# Patient Record
Sex: Female | Born: 1950 | Race: Black or African American | Hispanic: No | Marital: Married | Smoking: Never smoker
Health system: Southern US, Community
[De-identification: ages and names within clinical notes are randomized; demographics above are authoritative.]

## PROBLEM LIST (undated history)

## (undated) DIAGNOSIS — M7731 Calcaneal spur, right foot: Secondary | ICD-10-CM

## (undated) DIAGNOSIS — C50919 Malignant neoplasm of unspecified site of unspecified female breast: Secondary | ICD-10-CM

## (undated) DIAGNOSIS — Z923 Personal history of irradiation: Secondary | ICD-10-CM

## (undated) DIAGNOSIS — I10 Essential (primary) hypertension: Secondary | ICD-10-CM

## (undated) DIAGNOSIS — E785 Hyperlipidemia, unspecified: Secondary | ICD-10-CM

## (undated) DIAGNOSIS — T7840XA Allergy, unspecified, initial encounter: Secondary | ICD-10-CM

## (undated) HISTORY — DX: Malignant neoplasm of unspecified site of unspecified female breast: C50.919

## (undated) HISTORY — DX: Essential (primary) hypertension: I10

## (undated) HISTORY — DX: Allergy, unspecified, initial encounter: T78.40XA

## (undated) HISTORY — DX: Hyperlipidemia, unspecified: E78.5

---

## 1996-12-17 HISTORY — PX: ABDOMINAL HYSTERECTOMY: SHX81

## 1997-12-17 HISTORY — PX: CARPAL TUNNEL RELEASE: SHX101

## 1998-03-29 ENCOUNTER — Encounter: Admission: RE | Admit: 1998-03-29 | Discharge: 1998-03-29 | Payer: Self-pay | Admitting: Hematology and Oncology

## 1998-05-31 ENCOUNTER — Encounter: Admission: RE | Admit: 1998-05-31 | Discharge: 1998-05-31 | Payer: Self-pay | Admitting: Hematology and Oncology

## 1998-12-17 DIAGNOSIS — C50919 Malignant neoplasm of unspecified site of unspecified female breast: Secondary | ICD-10-CM

## 1998-12-17 DIAGNOSIS — Z923 Personal history of irradiation: Secondary | ICD-10-CM

## 1998-12-17 HISTORY — DX: Personal history of irradiation: Z92.3

## 1998-12-17 HISTORY — PX: BREAST LUMPECTOMY WITH AXILLARY LYMPH NODE DISSECTION: SHX5756

## 1998-12-17 HISTORY — DX: Malignant neoplasm of unspecified site of unspecified female breast: C50.919

## 1998-12-17 HISTORY — PX: BREAST LUMPECTOMY: SHX2

## 1999-04-18 ENCOUNTER — Encounter: Payer: Self-pay | Admitting: Obstetrics and Gynecology

## 1999-04-18 ENCOUNTER — Ambulatory Visit (HOSPITAL_COMMUNITY): Admission: RE | Admit: 1999-04-18 | Discharge: 1999-04-18 | Payer: Self-pay | Admitting: Obstetrics and Gynecology

## 1999-04-21 ENCOUNTER — Encounter: Payer: Self-pay | Admitting: Obstetrics and Gynecology

## 1999-04-21 ENCOUNTER — Ambulatory Visit (HOSPITAL_COMMUNITY): Admission: RE | Admit: 1999-04-21 | Discharge: 1999-04-21 | Payer: Self-pay | Admitting: Obstetrics and Gynecology

## 1999-05-02 ENCOUNTER — Ambulatory Visit (HOSPITAL_COMMUNITY): Admission: RE | Admit: 1999-05-02 | Discharge: 1999-05-02 | Payer: Self-pay | Admitting: *Deleted

## 1999-05-02 ENCOUNTER — Encounter: Payer: Self-pay | Admitting: *Deleted

## 1999-05-11 ENCOUNTER — Encounter: Admission: RE | Admit: 1999-05-11 | Discharge: 1999-08-09 | Payer: Self-pay | Admitting: Radiation Oncology

## 1999-06-02 ENCOUNTER — Encounter: Payer: Self-pay | Admitting: *Deleted

## 1999-06-02 ENCOUNTER — Ambulatory Visit (HOSPITAL_COMMUNITY): Admission: RE | Admit: 1999-06-02 | Discharge: 1999-06-02 | Payer: Self-pay | Admitting: *Deleted

## 1999-08-10 ENCOUNTER — Encounter: Admission: RE | Admit: 1999-08-10 | Discharge: 1999-11-08 | Payer: Self-pay | Admitting: Radiation Oncology

## 1999-09-21 ENCOUNTER — Ambulatory Visit (HOSPITAL_COMMUNITY): Admission: RE | Admit: 1999-09-21 | Discharge: 1999-09-21 | Payer: Self-pay | Admitting: Oncology

## 1999-09-21 ENCOUNTER — Encounter: Payer: Self-pay | Admitting: Oncology

## 2000-01-17 ENCOUNTER — Ambulatory Visit (HOSPITAL_BASED_OUTPATIENT_CLINIC_OR_DEPARTMENT_OTHER): Admission: RE | Admit: 2000-01-17 | Discharge: 2000-01-17 | Payer: Self-pay | Admitting: Orthopedic Surgery

## 2000-03-27 ENCOUNTER — Ambulatory Visit (HOSPITAL_BASED_OUTPATIENT_CLINIC_OR_DEPARTMENT_OTHER): Admission: RE | Admit: 2000-03-27 | Discharge: 2000-03-27 | Payer: Self-pay | Admitting: Orthopedic Surgery

## 2000-04-30 ENCOUNTER — Encounter: Payer: Self-pay | Admitting: Oncology

## 2000-04-30 ENCOUNTER — Encounter: Admission: RE | Admit: 2000-04-30 | Discharge: 2000-04-30 | Payer: Self-pay | Admitting: Oncology

## 2001-02-10 ENCOUNTER — Encounter: Admission: RE | Admit: 2001-02-10 | Discharge: 2001-03-04 | Payer: Self-pay | Admitting: Oncology

## 2001-04-30 ENCOUNTER — Encounter: Admission: RE | Admit: 2001-04-30 | Discharge: 2001-04-30 | Payer: Self-pay | Admitting: Oncology

## 2001-04-30 ENCOUNTER — Encounter: Payer: Self-pay | Admitting: Oncology

## 2001-08-20 ENCOUNTER — Ambulatory Visit (HOSPITAL_COMMUNITY): Admission: RE | Admit: 2001-08-20 | Discharge: 2001-08-20 | Payer: Self-pay | Admitting: *Deleted

## 2002-05-28 ENCOUNTER — Encounter: Payer: Self-pay | Admitting: Oncology

## 2002-05-28 ENCOUNTER — Encounter: Admission: RE | Admit: 2002-05-28 | Discharge: 2002-05-28 | Payer: Self-pay | Admitting: Oncology

## 2003-06-01 ENCOUNTER — Encounter: Admission: RE | Admit: 2003-06-01 | Discharge: 2003-06-01 | Payer: Self-pay | Admitting: Oncology

## 2003-06-01 ENCOUNTER — Encounter: Payer: Self-pay | Admitting: Oncology

## 2004-04-11 ENCOUNTER — Ambulatory Visit (HOSPITAL_COMMUNITY): Admission: RE | Admit: 2004-04-11 | Discharge: 2004-04-11 | Payer: Self-pay | Admitting: Family Medicine

## 2004-06-06 ENCOUNTER — Encounter: Admission: RE | Admit: 2004-06-06 | Discharge: 2004-06-06 | Payer: Self-pay | Admitting: Oncology

## 2004-10-24 ENCOUNTER — Ambulatory Visit: Payer: Self-pay | Admitting: Oncology

## 2005-04-17 ENCOUNTER — Ambulatory Visit: Payer: Self-pay | Admitting: Oncology

## 2005-06-07 ENCOUNTER — Encounter: Admission: RE | Admit: 2005-06-07 | Discharge: 2005-06-07 | Payer: Self-pay | Admitting: Oncology

## 2005-11-22 ENCOUNTER — Ambulatory Visit: Payer: Self-pay | Admitting: Oncology

## 2006-05-29 ENCOUNTER — Ambulatory Visit: Payer: Self-pay | Admitting: Oncology

## 2006-05-31 LAB — CBC WITH DIFFERENTIAL/PLATELET
BASO%: 0.3 % (ref 0.0–2.0)
EOS%: 3.9 % (ref 0.0–7.0)
MCH: 26.4 pg (ref 26.0–34.0)
MCHC: 33.3 g/dL (ref 32.0–36.0)
NEUT%: 70.6 % (ref 39.6–76.8)
RDW: 14.4 % (ref 11.3–14.5)
lymph#: 1.1 10*3/uL (ref 0.9–3.3)

## 2006-05-31 LAB — COMPREHENSIVE METABOLIC PANEL
ALT: 13 U/L (ref 0–40)
AST: 19 U/L (ref 0–37)
Calcium: 9.5 mg/dL (ref 8.4–10.5)
Chloride: 103 mEq/L (ref 96–112)
Creatinine, Ser: 0.72 mg/dL (ref 0.40–1.20)
Potassium: 3.5 mEq/L (ref 3.5–5.3)

## 2006-06-10 ENCOUNTER — Encounter: Admission: RE | Admit: 2006-06-10 | Discharge: 2006-06-10 | Payer: Self-pay | Admitting: Oncology

## 2006-06-20 ENCOUNTER — Ambulatory Visit (HOSPITAL_COMMUNITY): Admission: RE | Admit: 2006-06-20 | Discharge: 2006-06-20 | Payer: Self-pay | Admitting: Oncology

## 2006-11-27 ENCOUNTER — Ambulatory Visit: Payer: Self-pay | Admitting: Oncology

## 2006-12-17 HISTORY — PX: NASAL RECONSTRUCTION: SHX2069

## 2006-12-30 LAB — CBC WITH DIFFERENTIAL/PLATELET
BASO%: 0.3 % (ref 0.0–2.0)
Basophils Absolute: 0 10*3/uL (ref 0.0–0.1)
EOS%: 2.7 % (ref 0.0–7.0)
Eosinophils Absolute: 0.2 10*3/uL (ref 0.0–0.5)
HCT: 39.1 % (ref 34.8–46.6)
HGB: 13 g/dL (ref 11.6–15.9)
LYMPH%: 27.1 % (ref 14.0–48.0)
MCH: 26.3 pg (ref 26.0–34.0)
MCHC: 33.3 g/dL (ref 32.0–36.0)
MCV: 79.2 fL — ABNORMAL LOW (ref 81.0–101.0)
MONO#: 0.3 10*3/uL (ref 0.1–0.9)
MONO%: 4 % (ref 0.0–13.0)
NEUT#: 4.5 10*3/uL (ref 1.5–6.5)
NEUT%: 65.9 % (ref 39.6–76.8)
Platelets: 263 10*3/uL (ref 145–400)
RBC: 4.94 10*6/uL (ref 3.70–5.32)
RDW: 14.4 % (ref 11.3–14.5)
WBC: 6.8 10*3/uL (ref 3.9–10.0)
lymph#: 1.8 10*3/uL (ref 0.9–3.3)

## 2006-12-30 LAB — COMPREHENSIVE METABOLIC PANEL
ALT: 13 U/L (ref 0–35)
AST: 20 U/L (ref 0–37)
Albumin: 4.6 g/dL (ref 3.5–5.2)
Alkaline Phosphatase: 119 U/L — ABNORMAL HIGH (ref 39–117)
BUN: 22 mg/dL (ref 6–23)
CO2: 24 mEq/L (ref 19–32)
Calcium: 9.9 mg/dL (ref 8.4–10.5)
Chloride: 105 mEq/L (ref 96–112)
Creatinine, Ser: 0.71 mg/dL (ref 0.40–1.20)
Glucose, Bld: 92 mg/dL (ref 70–99)
Potassium: 3.5 mEq/L (ref 3.5–5.3)
Sodium: 142 mEq/L (ref 135–145)
Total Bilirubin: 0.9 mg/dL (ref 0.3–1.2)
Total Protein: 8 g/dL (ref 6.0–8.3)

## 2007-06-04 ENCOUNTER — Encounter (INDEPENDENT_AMBULATORY_CARE_PROVIDER_SITE_OTHER): Payer: Self-pay | Admitting: Otolaryngology

## 2007-06-04 ENCOUNTER — Ambulatory Visit (HOSPITAL_BASED_OUTPATIENT_CLINIC_OR_DEPARTMENT_OTHER): Admission: RE | Admit: 2007-06-04 | Discharge: 2007-06-04 | Payer: Self-pay | Admitting: Otolaryngology

## 2007-06-13 ENCOUNTER — Encounter: Admission: RE | Admit: 2007-06-13 | Discharge: 2007-06-13 | Payer: Self-pay | Admitting: Oncology

## 2007-09-01 ENCOUNTER — Ambulatory Visit: Payer: Self-pay | Admitting: Oncology

## 2007-09-03 LAB — COMPREHENSIVE METABOLIC PANEL
AST: 17 U/L (ref 0–37)
Albumin: 4.1 g/dL (ref 3.5–5.2)
Alkaline Phosphatase: 111 U/L (ref 39–117)
BUN: 10 mg/dL (ref 6–23)
Glucose, Bld: 102 mg/dL — ABNORMAL HIGH (ref 70–99)
Potassium: 3.8 mEq/L (ref 3.5–5.3)
Total Bilirubin: 0.8 mg/dL (ref 0.3–1.2)

## 2007-09-03 LAB — CBC WITH DIFFERENTIAL/PLATELET
Basophils Absolute: 0 10*3/uL (ref 0.0–0.1)
EOS%: 2.9 % (ref 0.0–7.0)
Eosinophils Absolute: 0.2 10*3/uL (ref 0.0–0.5)
HGB: 11.9 g/dL (ref 11.6–15.9)
LYMPH%: 23.2 % (ref 14.0–48.0)
MCH: 26.5 pg (ref 26.0–34.0)
MCV: 77.5 fL — ABNORMAL LOW (ref 81.0–101.0)
MONO%: 5.9 % (ref 0.0–13.0)
Platelets: 284 10*3/uL (ref 145–400)
RBC: 4.47 10*6/uL (ref 3.70–5.32)
RDW: 13.6 % (ref 11.3–14.5)

## 2008-02-24 ENCOUNTER — Ambulatory Visit: Payer: Self-pay | Admitting: Oncology

## 2008-02-26 LAB — COMPREHENSIVE METABOLIC PANEL
ALT: 15 U/L (ref 0–35)
Albumin: 4.2 g/dL (ref 3.5–5.2)
CO2: 26 mEq/L (ref 19–32)
Calcium: 9.4 mg/dL (ref 8.4–10.5)
Creatinine, Ser: 0.65 mg/dL (ref 0.40–1.20)
Glucose, Bld: 103 mg/dL — ABNORMAL HIGH (ref 70–99)
Total Bilirubin: 0.6 mg/dL (ref 0.3–1.2)
Total Protein: 7.7 g/dL (ref 6.0–8.3)

## 2008-02-26 LAB — CBC WITH DIFFERENTIAL/PLATELET
Basophils Absolute: 0 10*3/uL (ref 0.0–0.1)
HGB: 12.4 g/dL (ref 11.6–15.9)
MCH: 26.7 pg (ref 26.0–34.0)
MCHC: 34.3 g/dL (ref 32.0–36.0)
MONO#: 0.3 10*3/uL (ref 0.1–0.9)
NEUT#: 4 10*3/uL (ref 1.5–6.5)
Platelets: 265 10*3/uL (ref 145–400)
WBC: 5.9 10*3/uL (ref 3.9–10.0)
lymph#: 1.4 10*3/uL (ref 0.9–3.3)

## 2008-02-26 LAB — FERRITIN: Ferritin: 54 ng/mL (ref 10–291)

## 2008-02-26 LAB — IRON AND TIBC: Iron: 60 ug/dL (ref 42–145)

## 2008-06-14 ENCOUNTER — Encounter: Admission: RE | Admit: 2008-06-14 | Discharge: 2008-06-14 | Payer: Self-pay | Admitting: Oncology

## 2008-06-23 ENCOUNTER — Encounter: Admission: RE | Admit: 2008-06-23 | Discharge: 2008-06-23 | Payer: Self-pay | Admitting: Oncology

## 2008-06-24 ENCOUNTER — Ambulatory Visit: Payer: Self-pay | Admitting: Oncology

## 2008-06-29 LAB — CBC WITH DIFFERENTIAL/PLATELET
BASO%: 0.5 % (ref 0.0–2.0)
HGB: 12.1 g/dL (ref 11.6–15.9)
MCHC: 33.1 g/dL (ref 32.0–36.0)
MCV: 77.9 fL — ABNORMAL LOW (ref 81.0–101.0)
MONO%: 6.2 % (ref 0.0–13.0)
NEUT%: 66 % (ref 39.6–76.8)
Platelets: 292 10*3/uL (ref 145–400)
RBC: 4.7 10*6/uL (ref 3.70–5.32)
RDW: 14 % (ref 11.3–14.5)
WBC: 6.4 10*3/uL (ref 3.9–10.0)
lymph#: 1.6 10*3/uL (ref 0.9–3.3)

## 2008-06-29 LAB — COMPREHENSIVE METABOLIC PANEL
AST: 16 U/L (ref 0–37)
Glucose, Bld: 91 mg/dL (ref 70–99)
Potassium: 3.7 mEq/L (ref 3.5–5.3)
Total Bilirubin: 0.7 mg/dL (ref 0.3–1.2)

## 2008-06-29 LAB — IRON AND TIBC
Iron: 74 ug/dL (ref 42–145)
TIBC: 349 ug/dL (ref 250–470)
UIBC: 275 ug/dL

## 2008-06-29 LAB — FERRITIN: Ferritin: 36 ng/mL (ref 10–291)

## 2008-07-08 ENCOUNTER — Ambulatory Visit (HOSPITAL_COMMUNITY): Admission: RE | Admit: 2008-07-08 | Discharge: 2008-07-08 | Payer: Self-pay | Admitting: Oncology

## 2008-09-01 ENCOUNTER — Ambulatory Visit: Payer: Self-pay | Admitting: Oncology

## 2009-07-04 ENCOUNTER — Encounter: Admission: RE | Admit: 2009-07-04 | Discharge: 2009-07-04 | Payer: Self-pay | Admitting: Oncology

## 2009-08-24 ENCOUNTER — Ambulatory Visit: Payer: Self-pay | Admitting: Oncology

## 2009-08-25 LAB — CBC WITH DIFFERENTIAL/PLATELET
BASO%: 0.6 % (ref 0.0–2.0)
Basophils Absolute: 0 10*3/uL (ref 0.0–0.1)
Eosinophils Absolute: 0.2 10*3/uL (ref 0.0–0.5)
HGB: 12 g/dL (ref 11.6–15.9)
MCH: 26.6 pg (ref 25.1–34.0)
MCHC: 33.4 g/dL (ref 31.5–36.0)
MCV: 79.8 fL (ref 79.5–101.0)
MONO#: 0.4 10*3/uL (ref 0.1–0.9)
MONO%: 6.3 % (ref 0.0–14.0)
NEUT#: 3.8 10*3/uL (ref 1.5–6.5)
NEUT%: 65.9 % (ref 38.4–76.8)
Platelets: 258 10*3/uL (ref 145–400)
RBC: 4.52 10*6/uL (ref 3.70–5.45)
RDW: 13.7 % (ref 11.2–14.5)

## 2009-08-25 LAB — COMPREHENSIVE METABOLIC PANEL
Alkaline Phosphatase: 105 U/L (ref 39–117)
CO2: 23 mEq/L (ref 19–32)
Calcium: 8.9 mg/dL (ref 8.4–10.5)
Creatinine, Ser: 0.7 mg/dL (ref 0.40–1.20)
Potassium: 4 mEq/L (ref 3.5–5.3)

## 2009-12-23 ENCOUNTER — Encounter (INDEPENDENT_AMBULATORY_CARE_PROVIDER_SITE_OTHER): Payer: Self-pay | Admitting: *Deleted

## 2010-07-11 ENCOUNTER — Encounter: Admission: RE | Admit: 2010-07-11 | Discharge: 2010-07-11 | Payer: Self-pay | Admitting: Oncology

## 2010-08-15 ENCOUNTER — Telehealth: Payer: Self-pay | Admitting: Internal Medicine

## 2010-09-18 ENCOUNTER — Ambulatory Visit (HOSPITAL_COMMUNITY): Admission: RE | Admit: 2010-09-18 | Discharge: 2010-09-18 | Payer: Self-pay | Admitting: Orthopedic Surgery

## 2010-09-22 ENCOUNTER — Ambulatory Visit: Payer: Self-pay | Admitting: Oncology

## 2010-09-26 LAB — CBC WITH DIFFERENTIAL/PLATELET
BASO%: 0.6 % (ref 0.0–2.0)
Basophils Absolute: 0 10*3/uL (ref 0.0–0.1)
Eosinophils Absolute: 0.2 10*3/uL (ref 0.0–0.5)
HCT: 34.5 % — ABNORMAL LOW (ref 34.8–46.6)
HGB: 11.9 g/dL (ref 11.6–15.9)
MCH: 27.4 pg (ref 25.1–34.0)
MCV: 79.5 fL (ref 79.5–101.0)
MONO#: 0.3 10*3/uL (ref 0.1–0.9)
NEUT#: 3.2 10*3/uL (ref 1.5–6.5)
RDW: 13.7 % (ref 11.2–14.5)
WBC: 5.1 10*3/uL (ref 3.9–10.3)
lymph#: 1.3 10*3/uL (ref 0.9–3.3)

## 2010-09-26 LAB — COMPREHENSIVE METABOLIC PANEL
AST: 20 U/L (ref 0–37)
Albumin: 4 g/dL (ref 3.5–5.2)
CO2: 29 mEq/L (ref 19–32)
Glucose, Bld: 96 mg/dL (ref 70–99)
Total Bilirubin: 0.6 mg/dL (ref 0.3–1.2)

## 2010-10-02 ENCOUNTER — Encounter: Admission: RE | Admit: 2010-10-02 | Discharge: 2010-10-31 | Payer: Self-pay | Admitting: Orthopedic Surgery

## 2010-11-14 ENCOUNTER — Ambulatory Visit: Payer: Self-pay | Admitting: Oncology

## 2011-01-06 ENCOUNTER — Encounter: Payer: Self-pay | Admitting: Oncology

## 2011-01-07 ENCOUNTER — Encounter: Payer: Self-pay | Admitting: Oncology

## 2011-01-08 ENCOUNTER — Encounter: Payer: Self-pay | Admitting: Oncology

## 2011-01-18 NOTE — Progress Notes (Signed)
Summary: Schedule Colonoscopy  Phone Note Outgoing Call Call back at [facility] Phone [phone]   Call placed by: Harlow Mares CMA Duncan Dull),  August 15, 2010 9:15 AM Call placed to: Patient Summary of Call: Left a message on patients machine to call back. patient is due for a colonoscopy Initial call taken by: Harlow Mares CMA Duncan Dull),  August 15, 2010 9:15 AM  Follow-up for Phone Call        Left a message on the patient machine to call back and schedule a previsit and procedure with our office. A letter will be mailed to the patient.   Follow-up by: Harlow Mares CMA Duncan Dull),  August 24, 2010 2:45 PM

## 2011-01-18 NOTE — Letter (Signed)
Summary: Colonoscopy Letter  [facility]  [address] [place], [place] [address]   Phone: [phone]  Fax: [phone]      December 23, 2009 MRN: [id number]   Stacey Patel [address] [place], [place]  [address]   Dear Ms. Choy,   According to your medical record, it is time for you to schedule a Colonoscopy. The American Cancer Society recommends this procedure as a method to detect early colon cancer. Patients with a family history of colon cancer, or a personal history of colon polyps or inflammatory bowel disease are at increased risk.  This letter has beeen generated based on the recommendations made at the time of your procedure. If you feel that in your particular situation this may no longer apply, please contact our office.  Please call our office at [phone] to schedule this appointment or to update your records at your earliest convenience.  Thank you for cooperating with [place] to provide you with the very best care possible.   Sincerely,  Maryfrances Portugal. Juanda Chance, M.D.  [facility] Gastroenterology Division [phone]

## 2011-05-01 NOTE — Op Note (Signed)
Stacey Patel, Stacey Patel               ACCOUNT NO.:  [id number]   MEDICAL RECORD NO.:  [id number]          PATIENT TYPE:  AMB   LOCATION:  DSC                          FACILITY:  [facility]   PHYSICIAN:  Hermelinda Medicus, M.D.   DATE OF BIRTH:  Aug 31, 1951   DATE OF PROCEDURE:  DATE OF DISCHARGE:                               OPERATIVE REPORT   PREOPERATIVE DIAGNOSES:  1. Septal deviation.  2. Turbinate hypertrophy.  3. History of allergic rhinitis.  4. History of snoring.   POSTOPERATIVE DIAGNOSES:  1. Septal deviation.  2. Turbinate hypertrophy.  3. History of allergic rhinitis.  4. History of snoring.   OPERATION:  Septal reconstruction, turbinate reduction.   OPERATOR:  Hermelinda Medicus, M.D.   ANESTHESIA:  Local monitored anesthesia care, 1% Xylocaine with  epinephrine 5 mL and topical cocaine 125 mg of 200 which remainder was  destroyed.   ANESTHESIOLOGIST:  Quita Skye. Krista Blue, M.D.   PROCEDURE:  The patient was placed in the supine position.  Under local  MAC anesthesia was prepped and draped in the appropriate manner in the  usual head drape and operative drapes were placed.  The nose was  anesthetized and then the turbinates were reduced gaining considerable  space at this point.  The inferior turbinates were very large, redundant  and once they were outfractured, we could see much better.  The septum  was primarily pushed to the right and made a hemitransfixion incision  somewhat posteriorly in the quadrilateral cartilage, took a strip of  cartilage posteriorly, took a strip from the floor of the nose.  Then  used the open and closed Jansen-Middletons to remove a very deviated  ethmoid septum that was anterior and then the 4-mm chisel was used to  take down a spur that was approximately 1.1 cm pushing into the side of  the nose inferiorly.  This was removed by using the Lenoria Chime and the  opened and closed Jansen-Middletons.  Once this was achieved, the  remainder of the  septum was brought back to the midline and established  in the midline, and closure was completed using 4-0 through and through  septal suture x2.  Once we established the blanket suture, then we used  the L-med set at 12 electrocautery to shrink the inferior and lateral  aspect of the mucous membrane on the inferior turbinates.  Once this was  completed, an intranasal dressing was placed using Telfa.   The patient will return to see me tomorrow and then on Monday for  followup and removal of the packing tomorrow.  She is aware of the risks  and gains.  She cannot do any heavy lifting.  She should not do any  excessive exercise and to limit her diet over the next 24 hours.  Th  patient tolerated the procedure very well and will follow up with me  then at 3 weeks after the second visit and then in 6 weeks.           ______________________________  Hermelinda Medicus, M.D.     JC/MEDQ  D:  06/04/2007  T:  06/04/2007  Job:  [id number]   cc:   Pam Drown, M.D.

## 2011-05-01 NOTE — H&P (Signed)
NAMEREISE, HIETALA               ACCOUNT NO.:  [id number]   MEDICAL RECORD NO.:  [id number]          PATIENT TYPE:  AMB   LOCATION:  DSC                          FACILITY:  [facility]   PHYSICIAN:  Hermelinda Medicus, M.D.   DATE OF BIRTH:  [birth year]   DATE OF ADMISSION:  06/04/2007  DATE OF DISCHARGE:                              HISTORY & PHYSICAL   This patient is a 60 year old female who entered with some nasal  obstruction issues, also said her husband complained about some snoring  issues, but she has not had sleep apnea.  She on examination has had  considerable turbinate hypertrophy and a septal deviation to the right.  She has been on Xyzal 5, Allegra 180.  She has been on decongestants.  She has been on antibiotics in the past for sinus issues, but wishes to  see if she can get some increased nasal airway.  She now enters for a  septal reconstruction turbinate reduction.   PAST HISTORY:  Is that of:  1. Asthma.  2. Hay fever.  3. She had left breast cancer seven years ago.   She is on:  1. Femara 2.5.  2. Lisinopril/HCTZ 10/12.5.  3. Flaxseed.  4. Advil 200.  5. Vitamin E.  6. Calcium.   FAMILY HISTORY:  Quite unremarkable.   She exercises, drinks occasionally one glass of wine, otherwise she has  excellent review of systems.   She has no allergies to medications.   Her EKG showed a slight left ventricular hypertrophy.   PHYSICAL EXAMINATION:  GENERAL:  Reveals a well-nourished well-developed  female in no acute distress.  VITAL SIGNS:  Blood pressure is 130/92, she is 5 feet 2 inches, weighs  198, respirations 16, pulse 85.  HEENT:  Ears are clear.  The tympanic membranes are clear and move well.  Her nose shows a septal deviation severe to the right side pushing into  the side of her nose with a spur in the vomerine region.  Turbinates are  enlarged also with some appearance of allergy; she is taking her allergy  medication quite faithfully.  Oral cavity is  clear.  True cords, false  cords, epiglottis, base of tongue are clear of ulceration or mass.  True  cord mobility:  Gag reflex.  True mobility  EOMs, facial nerve are all  symmetrical with good shoulder strength on laryngeal examination.  Her  neck is free of any thyromegaly, cervical adenopathy or mass.  Posterior  triangle scalp and skin are clear.  Oral cavity shows no ulceration or  mass.  There are no salivary gland abnormalities.  CHEST:  Clear.  No rales, rhonchi or wheezes.  CARDIOVASCULAR:  Denies murmurs or gallops.  EXTREMITIES:  Unremarkable.   INITIAL DIAGNOSES:  1. Septal deviation.  2. Turbinate hypertrophy.  3. Mild snoring.  4. History of left breast cancer.  5. History of allergic rhinitis with associated asthma.           ______________________________  Hermelinda Medicus, M.D.     JC/MEDQ  D:  06/04/2007  T:  06/04/2007  Job:  [id number]  cc:   Pam Drown, M.D.

## 2011-05-04 NOTE — Op Note (Signed)
[place]. [facility]  Patient:    Stacey Patel                       MRN: [id number] Proc. Date: 01/17/00 Adm. Date:  [id number] Attending:  Marlowe Shores CC:         Artist Pais Mina Marble, M.D. (2)                           Operative Report  PREOPERATIVE DIAGNOSIS:  Right carpal tunnel syndrome.  POSTOPERATIVE DIAGNOSIS:  Right carpal tunnel syndrome.  OPERATION PERFORMED:  Right carpal tunnel release.  SURGEON:  Artist Pais. Mina Marble, M.D.  ANESTHESIA:  Monitored anesthesia care with 2+ plain lidocaine, 0.25% plain Marcaine field block performed by the surgeon.  TOURNIQUET TIME:  10 minutes.  COMPLICATIONS:  None.  DRAINS:  None.  DESCRIPTION OF PROCEDURE:  The patient was taken to the operating room and after the induction of adequate IV sedation, the right upper extremity was prepped and draped in the usual sterile fashion.  An Esmarch was used to exsanguinate the limb. The tourniquet was inflated to 250 mmHg.  At this point in time, a combination f 2% plain lidocaine and 0.25% Marcaine plain was injected into the palmar aspect in the right hand in the area of the thenar crease in line with the radial border f the ring finger.  Once this was done a 2 cm incision was made in the thenar crease. The incision was taken down through the skin and the subcutaneous tissues until the palmar fascia was identified.  The palmar fascia was split longitudinally thus exposing the distal one third of the transverse carpal ligament.  The distal one third of the transverse carpal ligament was divided with a 15 blade thus exposing the underlying median nerve and the contents of the carpal canal.  Once this was done using the Biomet security carpal tunnel release system, the remaining two thirds of the transverse carpal ligament was divided in its entirety. Inspection of the canal revealed no osseous lesions, ganglions, etc. present.  The  wound was thoroughly irrigated and then closed with a running 3-0 Prolene subcuticular stitch.  Steri-Strips, Xeroform, 4 x 4s, fluffs and a compressive hand dressing  were applied.  The patient tolerated the procedure well and went to the recovery room in stable fashion. DD:  01/17/00 TD:  01/17/00 Job: [id number]

## 2011-05-04 NOTE — Op Note (Signed)
[place]. [facility]  Patient:    Stacey, Patel                      MRN: [id number] Proc. Date: 03/27/00 Adm. Date:  [id number] Attending:  Marlowe Shores                           Operative Report  PREOPERATIVE DIAGNOSIS:  Carpal tunnel syndrome.  POSTOPERATIVE DIAGNOSIS:  Carpal tunnel syndrome.  PROCEDURE:  Left carpal tunnel release.  SURGEON:  Artist Pais. Mina Marble, M.D.  ANESTHESIA:  Monitored anesthesia care with 2% plain lidocaine, 1/4% Marcaine.  Field block performed by the surgeon, 6 cc.  TOURNIQUET TIME:  Nine minutes.  COMPLICATIONS:  None.  DRAINS:  None.  DESCRIPTION OF PROCEDURE:  The patient was taken to the operating room and after the induction of adequate of IV sedation, the left lower extremity was prepped and draped in the usual sterile fashion.  An Esmarch was used to _________ the limb.  The tourniquet was inflated to 250 mmHg.  At this point in time, 6 cc of a combination of 2% plain lidocaine and 1/4% plain Marcaine was injected into the palmar aspect of the left hand in the area of the thenar crease and out the radial border of the ring finger.  Once this was done and adequate anesthesia had been obtained, a 2 cm incision was made through the skin and subcutaneous tissue until the palmar fascia was identified.  The palmar fascia was split longitudinally until the distal 1/3 of the transverse carpal ligament was identified.  The distal 1/3 of the transverse carpal ligament was split using a 15 blade, thus exposing the underlying median nerve and contents of the carpal canal.  Next, using the Biomet security carpal tunnel release system, the remaining 2/3s of the transverse carpal ligament was divided in its entirety.  Visualization of the canal revealed no obvious ganglions or osseous lesions present.  The wound was thoroughly irrigated and then closed with a running Prolene subcuticular stitch of 3-0.   Steri-Strips, 4x4 fluff and compressive hand dressing was applied.  The patient tolerated the procedure well and went to the recovery room in stable fashion. DD:  03/27/00 TD:  03/28/00 Job: [id number]

## 2011-06-04 ENCOUNTER — Encounter (HOSPITAL_BASED_OUTPATIENT_CLINIC_OR_DEPARTMENT_OTHER): Payer: 59 | Admitting: Oncology

## 2011-06-04 DIAGNOSIS — Z853 Personal history of malignant neoplasm of breast: Secondary | ICD-10-CM

## 2011-06-04 DIAGNOSIS — I890 Lymphedema, not elsewhere classified: Secondary | ICD-10-CM

## 2011-06-25 ENCOUNTER — Other Ambulatory Visit: Payer: Self-pay | Admitting: Oncology

## 2011-06-25 DIAGNOSIS — Z1231 Encounter for screening mammogram for malignant neoplasm of breast: Secondary | ICD-10-CM

## 2011-07-13 ENCOUNTER — Ambulatory Visit
Admission: RE | Admit: 2011-07-13 | Discharge: 2011-07-13 | Disposition: A | Discharge status: Home / Self Care | Payer: 59 | Source: Ambulatory Visit | Attending: Oncology | Admitting: Oncology

## 2011-07-13 ENCOUNTER — Ambulatory Visit: Payer: 59

## 2011-07-13 DIAGNOSIS — Z1231 Encounter for screening mammogram for malignant neoplasm of breast: Secondary | ICD-10-CM

## 2011-09-14 LAB — CREATININE, SERUM
Creatinine, Ser: 0.68
GFR calc non Af Amer: >60

## 2011-10-03 LAB — CBC
HCT: 37.1
Hemoglobin: 12.3
MCHC: 33.2
MCV: 78.8
Platelets: 274
RBC: 4.71
RDW: 13.6
WBC: 6.2

## 2011-10-03 LAB — URINE MICROSCOPIC-ADD ON: RBC / HPF: NONE SEEN

## 2011-10-03 LAB — URINALYSIS, ROUTINE W REFLEX MICROSCOPIC
Bilirubin Urine: NEGATIVE
Glucose, UA: NEGATIVE
Hgb urine dipstick: NEGATIVE
Ketones, ur: NEGATIVE
Nitrite: NEGATIVE
Protein, ur: NEGATIVE
Specific Gravity, Urine: 1.022 (ref 1.005–1.035)
Urobilinogen, UA: 0.2
pH: 6.5

## 2011-10-03 LAB — POCT HEMOGLOBIN-HEMACUE
Hemoglobin: 12.5
Operator id: 208731

## 2012-03-17 ENCOUNTER — Encounter: Payer: Self-pay | Admitting: Internal Medicine

## 2012-06-24 ENCOUNTER — Other Ambulatory Visit: Payer: Self-pay | Admitting: Oncology

## 2012-06-24 DIAGNOSIS — Z1231 Encounter for screening mammogram for malignant neoplasm of breast: Secondary | ICD-10-CM

## 2012-06-24 DIAGNOSIS — Z853 Personal history of malignant neoplasm of breast: Secondary | ICD-10-CM

## 2012-07-16 ENCOUNTER — Ambulatory Visit
Admission: RE | Admit: 2012-07-16 | Discharge: 2012-07-16 | Disposition: A | Discharge status: Home / Self Care | Payer: 59 | Source: Ambulatory Visit | Attending: Oncology | Admitting: Oncology

## 2012-07-16 DIAGNOSIS — Z1231 Encounter for screening mammogram for malignant neoplasm of breast: Secondary | ICD-10-CM

## 2012-07-16 DIAGNOSIS — Z853 Personal history of malignant neoplasm of breast: Secondary | ICD-10-CM

## 2012-10-14 ENCOUNTER — Telehealth: Payer: Self-pay | Admitting: Oncology

## 2012-10-14 NOTE — Telephone Encounter (Signed)
Pt called and l/m that she thought she had an appt 10/30.  No appts sch and per mosaiq she missed an appt last year.   Went ahead and r/s her appt

## 2012-10-24 ENCOUNTER — Other Ambulatory Visit (HOSPITAL_BASED_OUTPATIENT_CLINIC_OR_DEPARTMENT_OTHER): Payer: 59 | Admitting: Lab

## 2012-10-24 ENCOUNTER — Telehealth: Payer: Self-pay | Admitting: Oncology

## 2012-10-24 ENCOUNTER — Ambulatory Visit (HOSPITAL_BASED_OUTPATIENT_CLINIC_OR_DEPARTMENT_OTHER): Payer: 59 | Admitting: Oncology

## 2012-10-24 ENCOUNTER — Encounter: Payer: Self-pay | Admitting: Oncology

## 2012-10-24 VITALS — BP 130/86 | HR 86 | Temp 97.1°F | Resp 20 | Ht 62.0 in | Wt 190.3 lb

## 2012-10-24 DIAGNOSIS — Z853 Personal history of malignant neoplasm of breast: Secondary | ICD-10-CM

## 2012-10-24 DIAGNOSIS — Z1231 Encounter for screening mammogram for malignant neoplasm of breast: Secondary | ICD-10-CM

## 2012-10-24 DIAGNOSIS — C50919 Malignant neoplasm of unspecified site of unspecified female breast: Secondary | ICD-10-CM | POA: Insufficient documentation

## 2012-10-24 DIAGNOSIS — Z170 Estrogen receptor positive status [ER+]: Secondary | ICD-10-CM

## 2012-10-24 LAB — CBC WITH DIFFERENTIAL/PLATELET
Basophils Absolute: 0.1 10*3/uL (ref 0.0–0.1)
Eosinophils Absolute: 0.2 10*3/uL (ref 0.0–0.5)
HGB: 12.1 g/dL (ref 11.6–15.9)
LYMPH%: 21.7 % (ref 14.0–49.7)
MCV: 80.2 fL (ref 79.5–101.0)
MONO%: 5 % (ref 0.0–14.0)
NEUT#: 4.2 10*3/uL (ref 1.5–6.5)
Platelets: 242 10*3/uL (ref 145–400)

## 2012-10-24 LAB — COMPREHENSIVE METABOLIC PANEL (CC13)
Albumin: 3.8 g/dL (ref 3.5–5.0)
Alkaline Phosphatase: 113 U/L (ref 40–150)
BUN: 14 mg/dL (ref 7.0–26.0)
Glucose: 101 mg/dl — ABNORMAL HIGH (ref 70–99)
Total Bilirubin: 0.81 mg/dL (ref 0.20–1.20)

## 2012-10-24 NOTE — Telephone Encounter (Signed)
appts made and printed for pt aom °

## 2012-10-24 NOTE — Patient Instructions (Signed)
3D mammograms (tomo) are best for you due to dense breast tissue

## 2012-10-26 NOTE — Progress Notes (Signed)
OFFICE PROGRESS NOTE   10/24/2012  Physicians: W.McNeil  INTERVAL HISTORY:  Patient is seen, alone for visit, in yearly follow up of her history of breast cancer, on observation thru this office since she completed treatment March 2009.  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 She had local radiation, tamoxifen x 5 years thru Sept 2005 and Femara from Oct 2005 thru March 2009. She had 3D/tomo mammograms at [facility] 07-16-12, still heterogeneously dense breast tissue such that the 3D mammography is very appropriate, as we have discussed now. Last breast MRI was Oct 2011. With availability of 3D mammography and as she is out 13 years from the diagnosis, we will not do routine breast MRIs now.  Patient has been doing generally well since she was here last. She had PE with Dr Uvaldo Rising recently, has been less achy since off lipitor past few months. Recent allergy symptoms are better and she has had flu vaccine. She had neck stiffness and pain related to holding phone on shoulder at work, resolved with earpiece. She has had no other new or different pain, no changes on breast self exam, intentional weight loss with diet changes and is still doing lots of walking with work. She has had no bleeding, no recent infections, no other complaints that seem related to the cancer history or that treatment. Remainder of 10 point Review of Systems negative.  Objective:  Vital signs in last 24 hours:  BP 130/86  Pulse 86  Temp 97.1 F (36.2 C) (Oral)  Resp 20  Ht 5\' 2"  (1.575 m)  Wt 190 lb 4.8 oz (86.32 kg)  BMI 34.81 kg/m2 Weight is down 8 lbs since last visit. Easily mobile, looks comfortable, just delightful as always.    HEENT:PERRLA, sclera clear, anicteric and oropharynx clear, no lesions LymphaticsCervical, supraclavicular, and axillary nodes normal. Resp: clear to auscultation bilaterally  and normal percussion bilaterally Cardio: regular rate and rhythm GI: obese, soft, nontender, no appreciable HSM Extremities: extremities normal, atraumatic, no cyanosis or edema Neuro:nonfocal Breasts: left with lumpectomy scar, no dominant mass or skin/ nipple findings, nothing in axilla and no swelling LUE. Right breast without dominant mass or other findings of concern, axilla and RUE not remarkable   Lab Results:  Results for orders placed in visit on 10/24/12  CBC WITH DIFFERENTIAL      Component Value Range   WBC 6.1  3.9 - 10.3 10e3/uL   NEUT# 4.2  1.5 - 6.5 10e3/uL   HGB 12.1  11.6 - 15.9 g/dL   HCT 21.3  08.6 - 57.8 %   Platelets 242  145 - 400 10e3/uL   MCV 80.2  79.5 - 101.0 fL   MCH 26.8  25.1 - 34.0 pg   MCHC 33.5  31.5 - 36.0 g/dL   RBC 4.69  6.29 - 5.28 10e6/uL   RDW 13.7  11.2 - 14.5 %   lymph# 1.3  0.9 - 3.3 10e3/uL   MONO# 0.3  0.1 - 0.9 10e3/uL   Eosinophils Absolute 0.2  0.0 - 0.5 10e3/uL   Basophils Absolute 0.1  0.0 - 0.1 10e3/uL   NEUT% 69.3  38.4 - 76.8 %   LYMPH% 21.7  14.0 - 49.7 %   MONO% 5.0  0.0 - 14.0 %   EOS% 2.9  0.0 - 7.0 %   BASO% 1.1  0.0 - 2.0 %  COMPREHENSIVE METABOLIC PANEL (CC13)  Component Value Range   Sodium 141  136 - 145 mEq/L   Potassium 3.7  3.5 - 5.1 mEq/L   Chloride 103  98 - 107 mEq/L   CO2 31 (*) 22 - 29 mEq/L   Glucose 101 (*) 70 - 99 mg/dl   BUN 47.8  7.0 - 29.5 mg/dL   Creatinine 0.7  0.6 - 1.1 mg/dL   Total Bilirubin 6.21  0.20 - 1.20 mg/dL   Alkaline Phosphatase 113  40 - 150 U/L   AST 19  5 - 34 U/L   ALT 12  0 - 55 U/L   Total Protein 7.6  6.4 - 8.3 g/dL   Albumin 3.8  3.5 - 5.0 g/dL   Calcium 9.7  8.4 - 30.8 mg/dL     Studies/Results:  [facility]  Medications: I have reviewed the patient's current medications.  Assessment/Plan: 1.T1N0 left breast cancer: history as above. 3D/tomo mammography yearly. She prefers to continue some follow up at this office, and I am glad to  see her back in a year or sooner if needed. 2.HTn, elevated lipids 3.some intentional weight loss and I have encouraged her to continue this   Patient is comfortable with discussion and plan     Reece Packer, MD

## 2012-10-27 ENCOUNTER — Telehealth: Payer: Self-pay

## 2012-10-27 NOTE — Telephone Encounter (Signed)
Informed Ms. Ailes about chemistries as noted by Dr. Darrold Span.  Patient did not request a copy of labs.

## 2012-10-27 NOTE — Telephone Encounter (Signed)
Message copied by Lorine Bears on Mon Oct 27, 2012  4:08 PM ------      Message from: Reece Packer      Created: Fri Oct 24, 2012  5:29 PM       Labs seen and need follow up: please let her know CMET all normal. Fine to send copies of labs if she wants

## 2013-05-12 ENCOUNTER — Other Ambulatory Visit: Payer: Self-pay | Admitting: Oncology

## 2013-05-12 ENCOUNTER — Telehealth: Payer: Self-pay | Admitting: *Deleted

## 2013-05-12 DIAGNOSIS — C50919 Malignant neoplasm of unspecified site of unspecified female breast: Secondary | ICD-10-CM

## 2013-05-12 NOTE — Telephone Encounter (Signed)
sw pt gv appt d/t for 06/10/13 @ 1:30pm. Pt is aware...td

## 2013-06-10 ENCOUNTER — Telehealth: Payer: Self-pay | Admitting: Oncology

## 2013-06-10 ENCOUNTER — Ambulatory Visit (HOSPITAL_COMMUNITY)
Admission: RE | Admit: 2013-06-10 | Discharge: 2013-06-10 | Disposition: A | Discharge status: Home / Self Care | Payer: 59 | Source: Ambulatory Visit | Attending: Oncology | Admitting: Oncology

## 2013-06-10 ENCOUNTER — Ambulatory Visit (HOSPITAL_BASED_OUTPATIENT_CLINIC_OR_DEPARTMENT_OTHER): Payer: 59 | Admitting: Oncology

## 2013-06-10 ENCOUNTER — Other Ambulatory Visit (HOSPITAL_BASED_OUTPATIENT_CLINIC_OR_DEPARTMENT_OTHER): Payer: 59 | Admitting: Lab

## 2013-06-10 ENCOUNTER — Encounter: Payer: Self-pay | Admitting: Oncology

## 2013-06-10 VITALS — BP 112/71 | HR 105 | Temp 97.5°F | Resp 18 | Ht 62.0 in | Wt 195.3 lb

## 2013-06-10 DIAGNOSIS — C50912 Malignant neoplasm of unspecified site of left female breast: Secondary | ICD-10-CM

## 2013-06-10 DIAGNOSIS — M25859 Other specified joint disorders, unspecified hip: Secondary | ICD-10-CM | POA: Insufficient documentation

## 2013-06-10 DIAGNOSIS — Z853 Personal history of malignant neoplasm of breast: Secondary | ICD-10-CM | POA: Insufficient documentation

## 2013-06-10 DIAGNOSIS — C50919 Malignant neoplasm of unspecified site of unspecified female breast: Secondary | ICD-10-CM

## 2013-06-10 DIAGNOSIS — M25559 Pain in unspecified hip: Secondary | ICD-10-CM | POA: Insufficient documentation

## 2013-06-10 DIAGNOSIS — M76899 Other specified enthesopathies of unspecified lower limb, excluding foot: Secondary | ICD-10-CM | POA: Insufficient documentation

## 2013-06-10 DIAGNOSIS — M8530 Osteitis condensans, unspecified site: Secondary | ICD-10-CM | POA: Insufficient documentation

## 2013-06-10 LAB — COMPREHENSIVE METABOLIC PANEL (CC13)
Albumin: 3.7 g/dL (ref 3.5–5.0)
Alkaline Phosphatase: 92 U/L (ref 40–150)
BUN: 15.9 mg/dL (ref 7.0–26.0)
CO2: 29 mEq/L (ref 22–29)
Calcium: 9.4 mg/dL (ref 8.4–10.4)
Glucose: 107 mg/dl — ABNORMAL HIGH (ref 70–99)
Potassium: 3.2 mEq/L — ABNORMAL LOW (ref 3.5–5.1)

## 2013-06-10 LAB — CBC WITH DIFFERENTIAL/PLATELET
Basophils Absolute: 0.1 10*3/uL (ref 0.0–0.1)
Eosinophils Absolute: 0.2 10*3/uL (ref 0.0–0.5)
HGB: 11.9 g/dL (ref 11.6–15.9)
MCV: 78.5 fL — ABNORMAL LOW (ref 79.5–101.0)
MONO%: 6.9 % (ref 0.0–14.0)
NEUT#: 4.2 10*3/uL (ref 1.5–6.5)
Platelets: 249 10*3/uL (ref 145–400)
RDW: 14.3 % (ref 11.2–14.5)

## 2013-06-10 NOTE — Patient Instructions (Signed)
Call if needed prior to next appointment   [phone] 

## 2013-06-10 NOTE — Progress Notes (Signed)
OFFICE PROGRESS NOTE   06/10/2013   Physicians: W.McNeil, D.Brodie  INTERVAL HISTORY:   Patient is seen, alone for visit, in follow up of history of breast cancer, on observation thru this office since she completed treatment in March 2009. She has been well other than problems with left hip thought bursitis, being treated by PCP Dr Uvaldo Rising, with follow up visit upcoming. 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 She does not have PAC.  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 She had local radiation, tamoxifen x 5 years thru Sept 2005 and Femara from Oct 2005 thru March 2009. She had 3D/tomo mammograms at [facility] 07-16-12, still heterogeneously dense breast tissue such that the 3D mammography is very appropriate, as we have discussed. Last breast MRI was Oct 2011. With availability of 3D mammography and as she is out 14 years from the diagnosis, we will not do routine breast MRIs now. Patient prefers to continue yearly follow up at this office.  Left hip pain x 2+ months, worse with direct pressure or with certain positions. No low back discomfort, however pain at times runs down lateral left LE. No fever or symptoms of infection. No trauma. No other pain. Energy would be good if not for this discomfort. No respiratory or GI complaints. No bleeding.  Remainder of 10 point Review of Systems negative.  Objective:  Vital signs in last 24 hours:  BP 112/71  Pulse 105  Temp(Src) 97.5 F (36.4 C) (Oral)  Resp 18  Ht 5\' 2"  (1.575 m)  Wt 195 lb 4.8 oz (88.587 kg)  BMI 35.71 kg/m2  Weight is up 5 lbs. Ambulatory a little slowly from left hip symptoms.  HEENT:PERRLA, sclera clear, anicteric and oropharynx clear, no lesions LymphaticsCervical, supraclavicular, and axillary nodes normal. Resp: clear to auscultation bilaterally and normal percussion bilaterally Cardio: regular  rate and rhythm GI: obese, soft, nontender, normal bowel sounds, no appreciable HSM Extremities: no pitting edema, cords, tenderness. Neuro:nonfocal Breast: left with well healed lumpectomy scar, no dominant mass, no skin or nipple findings and left axilla benign. Right without dominant mass, skin or nipple findings Spine nontender to palpation Skin without rash or ecchymosis  Lab Results:  Results for orders placed in visit on 06/10/13  CBC WITH DIFFERENTIAL      Result Value Range   WBC 6.5  3.9 - 10.3 10e3/uL   NEUT# 4.2  1.5 - 6.5 10e3/uL   HGB 11.9  11.6 - 15.9 g/dL   HCT 19.1  47.8 - 29.5 %   Platelets 249  145 - 400 10e3/uL   MCV 78.5 (*) 79.5 - 101.0 fL   MCH 26.2  25.1 - 34.0 pg   MCHC 33.4  31.5 - 36.0 g/dL   RBC 6.21  3.08 - 6.57 10e6/uL   RDW 14.3  11.2 - 14.5 %   lymph# 1.6  0.9 - 3.3 10e3/uL   MONO# 0.4  0.1 - 0.9 10e3/uL   Eosinophils Absolute 0.2  0.0 - 0.5 10e3/uL   Basophils Absolute 0.1  0.0 - 0.1 10e3/uL   NEUT% 64.8  38.4 - 76.8 %   LYMPH% 24.6  14.0 - 49.7 %   MONO% 6.9  0.0 - 14.0 %   EOS% 2.4  0.0 - 7.0 %   BASO% 1.3  0.0 - 2.0 %  COMPREHENSIVE METABOLIC PANEL (CC13)      Result Value  Range   Sodium 141  136 - 145 mEq/L   Potassium 3.2 (*) 3.5 - 5.1 mEq/L   Chloride 103  98 - 107 mEq/L   CO2 29  22 - 29 mEq/L   Glucose 107 (*) 70 - 99 mg/dl   BUN 84.6  7.0 - 96.2 mg/dL   Creatinine 0.8  0.6 - 1.1 mg/dL   Total Bilirubin 9.52  0.20 - 1.20 mg/dL   Alkaline Phosphatase 92  40 - 150 U/L   AST 22  5 - 34 U/L   ALT 17  0 - 55 U/L   Total Protein 7.7  6.4 - 8.3 g/dL   Albumin 3.7  3.5 - 5.0 g/dL   Calcium 9.4  8.4 - 84.1 mg/dL     Studies/Results:  Dg Hip Complete Left  06/10/2013   *RADIOLOGY REPORT*  Clinical Data: Increasing posterior left hip pain over 2 months without injury, history of breast cancer 13 years ago  LEFT HIP - COMPLETE 2+ VIEW  Comparison: None  Findings: Bilateral hip joint space narrowing. Scattered mild pelvic enthesopathy.  SI joints symmetric. Minimal osteitis pubis. No acute fracture, dislocation, destruction.  IMPRESSION: Mild degenerative changes of both hips. No acute abnormalities.   Original Report Authenticated By: Ulyses Southward, M.D.   She is due bilateral mammograms at [facility] early Aug, which should be 3D/ tomo due to dense breast tissue and personal history of breast cancer.   Medications: I have reviewed the patient's current medications.  Assessment/Plan:  1.T1N0 left breast cancer: history as above, no clinical evidence of active disease. She will have 3D mammography in early August and will be seen back by medical oncology in 1 year, or sooner if needed. 2.left hip pain: plain Xrays with mild degenerative findings, no concern for metastatic disease by that imaging 3.obesity 4.HTN, elevated lipids   Patient is in agreement with plan above.     Reece Packer, MD   06/10/2013, 9:36 PM

## 2013-06-12 ENCOUNTER — Telehealth: Payer: Self-pay

## 2013-06-12 NOTE — Telephone Encounter (Signed)
Message copied by Lorine Bears on Fri Jun 12, 2013  3:53 PM ------      Message from: Reece Packer      Created: Wed Jun 10, 2013  4:03 PM       Labs seen and need follow up: please let her know no fracture, dislocation and no suggestion of cancer. She has some arthritis of both hips that the radiologist can tell. Keep appointment with Dr Uvaldo Rising as scheduled and we will send report to her. ------

## 2013-06-12 NOTE — Telephone Encounter (Signed)
Spoke with Ms. Shackleford and told her the results of the x-ray as noted below by Dr. Darrold Span. Sent a copy of the x-ray report to Dr. Uvaldo Rising as requested by Dr. Darrold Span.

## 2013-06-15 ENCOUNTER — Telehealth: Payer: Self-pay | Admitting: *Deleted

## 2013-06-15 NOTE — Telephone Encounter (Signed)
Message copied by Phillis Knack on Mon Jun 15, 2013 10:57 AM ------      Message from: Jama Flavors P      Created: Mon Jun 15, 2013  9:02 AM       Labs seen and need follow up  Please let her know K+ just a little low on these labs. Needs to increase at least in diet ------

## 2013-06-15 NOTE — Telephone Encounter (Signed)
Pt notified of results below. States she had a craving for bananas and oj this weekend. Has been drinking extra oj and will increase potassium in diet

## 2013-07-17 ENCOUNTER — Ambulatory Visit: Payer: 59

## 2013-07-28 ENCOUNTER — Ambulatory Visit: Payer: 59 | Attending: Family Medicine

## 2013-07-28 DIAGNOSIS — R269 Unspecified abnormalities of gait and mobility: Secondary | ICD-10-CM | POA: Insufficient documentation

## 2013-07-28 DIAGNOSIS — M25559 Pain in unspecified hip: Secondary | ICD-10-CM | POA: Insufficient documentation

## 2013-07-28 DIAGNOSIS — IMO0001 Reserved for inherently not codable concepts without codable children: Secondary | ICD-10-CM | POA: Insufficient documentation

## 2013-07-30 ENCOUNTER — Ambulatory Visit: Payer: 59

## 2013-08-04 ENCOUNTER — Ambulatory Visit
Admission: RE | Admit: 2013-08-04 | Discharge: 2013-08-04 | Disposition: A | Discharge status: Home / Self Care | Payer: 59 | Source: Ambulatory Visit | Attending: Oncology | Admitting: Oncology

## 2013-08-04 ENCOUNTER — Ambulatory Visit: Payer: 59

## 2013-08-04 DIAGNOSIS — Z1231 Encounter for screening mammogram for malignant neoplasm of breast: Secondary | ICD-10-CM

## 2013-08-06 ENCOUNTER — Ambulatory Visit: Payer: 59 | Admitting: Physical Therapy

## 2013-08-11 ENCOUNTER — Ambulatory Visit: Payer: 59

## 2013-08-13 ENCOUNTER — Ambulatory Visit: Payer: 59

## 2013-08-18 ENCOUNTER — Ambulatory Visit: Payer: 59 | Attending: Family Medicine

## 2013-08-18 DIAGNOSIS — R269 Unspecified abnormalities of gait and mobility: Secondary | ICD-10-CM | POA: Insufficient documentation

## 2013-08-18 DIAGNOSIS — M25559 Pain in unspecified hip: Secondary | ICD-10-CM | POA: Insufficient documentation

## 2013-08-18 DIAGNOSIS — IMO0001 Reserved for inherently not codable concepts without codable children: Secondary | ICD-10-CM | POA: Insufficient documentation

## 2013-08-20 ENCOUNTER — Ambulatory Visit: Payer: 59 | Admitting: Rehabilitation

## 2013-08-25 ENCOUNTER — Ambulatory Visit: Payer: 59 | Admitting: Rehabilitation

## 2013-08-27 ENCOUNTER — Ambulatory Visit: Payer: 59

## 2013-09-14 ENCOUNTER — Other Ambulatory Visit: Payer: 59 | Admitting: Lab

## 2013-09-14 ENCOUNTER — Ambulatory Visit: Payer: 59 | Admitting: Oncology

## 2013-10-30 ENCOUNTER — Ambulatory Visit: Payer: 59 | Admitting: Oncology

## 2014-06-22 ENCOUNTER — Encounter: Payer: Self-pay | Admitting: Internal Medicine

## 2014-07-07 ENCOUNTER — Other Ambulatory Visit: Payer: Self-pay

## 2014-07-07 DIAGNOSIS — Z1231 Encounter for screening mammogram for malignant neoplasm of breast: Secondary | ICD-10-CM

## 2014-08-06 ENCOUNTER — Ambulatory Visit
Admission: RE | Admit: 2014-08-06 | Discharge: 2014-08-06 | Disposition: A | Discharge status: Home / Self Care | Payer: 59 | Source: Ambulatory Visit

## 2014-08-06 ENCOUNTER — Ambulatory Visit (AMBULATORY_SURGERY_CENTER): Payer: Self-pay | Admitting: *Deleted

## 2014-08-06 VITALS — Ht 62.0 in | Wt 196.0 lb

## 2014-08-06 DIAGNOSIS — Z1211 Encounter for screening for malignant neoplasm of colon: Secondary | ICD-10-CM

## 2014-08-06 DIAGNOSIS — Z1231 Encounter for screening mammogram for malignant neoplasm of breast: Secondary | ICD-10-CM

## 2014-08-06 MED ORDER — MOVIPREP 100 G PO SOLR: 1.0000 | Freq: Once | ORAL | Status: DC

## 2014-08-06 NOTE — Progress Notes (Signed)
No egg or soy allergy. No anesthesia problems.  No home O2.  No diet meds.  

## 2014-08-20 ENCOUNTER — Encounter: Payer: 59 | Admitting: Internal Medicine

## 2014-08-25 ENCOUNTER — Encounter: Payer: Self-pay | Admitting: Internal Medicine

## 2014-08-25 ENCOUNTER — Ambulatory Visit (AMBULATORY_SURGERY_CENTER): Payer: 59 | Admitting: Internal Medicine

## 2014-08-25 VITALS — BP 146/59 | HR 61 | Temp 97.8°F | Resp 18 | Ht 62.0 in | Wt 196.0 lb

## 2014-08-25 DIAGNOSIS — K639 Disease of intestine, unspecified: Secondary | ICD-10-CM

## 2014-08-25 DIAGNOSIS — K6389 Other specified diseases of intestine: Secondary | ICD-10-CM

## 2014-08-25 DIAGNOSIS — D126 Benign neoplasm of colon, unspecified: Secondary | ICD-10-CM

## 2014-08-25 DIAGNOSIS — Z1211 Encounter for screening for malignant neoplasm of colon: Secondary | ICD-10-CM

## 2014-08-25 MED ORDER — SODIUM CHLORIDE 0.9 % IV SOLN: 500.0000 mL | INTRAVENOUS | Status: DC

## 2014-08-25 NOTE — Progress Notes (Signed)
A/ox3, pleased with MAC, report to RN 

## 2014-08-25 NOTE — Op Note (Signed)
[place] [facility] [place]  [employer] [place], [id number]   COLONOSCOPY PROCEDURE REPORT  PATIENT: Stacey Patel, Stacey Patel  MR#: [id number] BIRTHDATE: 08/04/1951 , 9  yrs. old GENDER: Female ENDOSCOPIST: Lafayette Dragon, MD REFERRED RJ:JOACZ Addison Lank, M.D. ,Dr Marko Plume PROCEDURE DATE:  08/25/2014 PROCEDURE:   Colonoscopy with biopsy First Screening Colonoscopy - Avg.  risk and is 50 yrs.  old or older - No.  Prior Negative Screening - Now for repeat screening. N/A  History of Adenoma - Now for follow-up colonoscopy & has been > or = to 3 yrs.  N/A  Polyps Removed Today? No.  Recommend repeat exam, <10 yrs? No. ASA CLASS:   Class II INDICATIONS:Average risk patient for colon cancer and prior colonoscopy in February 2004 was normal. MEDICATIONS: MAC sedation, administered by CRNA and propofol (Diprivan) 200mg  IV  DESCRIPTION OF PROCEDURE:   After the risks benefits and alternatives of the procedure were thoroughly explained, informed consent was obtained.  A digital rectal exam revealed no abnormalities of the rectum.   The LB PFC-H190 [id number] and LB PFC-H190 [id number]  endoscope was introduced through the anus and advanced to the cecum, which was identified by both the appendix and ileocecal valve. No adverse events experienced.   The quality of the prep was good, using MoviPrep  The instrument was then slowly withdrawn as the colon was fully examined.      COLON FINDINGS: There was mild diverticulosis noted in the sigmoid colon with associated muscular hypertrophy.   Submucosal nodule at 50 cm in the descending colon.  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  Biopsies obtained.  Retroflexed views revealed no abnormalities. The time to cecum=5 minutes 14 seconds. Withdrawal time=8 minutes 41 seconds.  The scope was withdrawn and the procedure completed. COMPLICATIONS: There were no complications.  ENDOSCOPIC IMPRESSION: 1.   There was mild diverticulosis  noted in the sigmoid colon 2.   Submucosal nodule at 50 cm in the descending colon.  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  Biopsies obtained  RECOMMENDATIONS: 1.  Await pathology results 2.  high fiber diet Recall colonoscopy in 10 to   eSigned:  Lafayette Dragon, MD 08/25/2014 10:22 AM   cc:   PATIENT NAME:  Stacey Patel, Stacey Patel MR#: [id number]

## 2014-08-25 NOTE — Patient Instructions (Signed)
YOU HAD AN ENDOSCOPIC PROCEDURE TODAY AT THE [facility]: Refer to the procedure report that was given to you for any specific questions about what was found during the examination.  If the procedure report does not answer your questions, please call your gastroenterologist to clarify.  If you requested that your care partner not be given the details of your procedure findings, then the procedure report has been included in a sealed envelope for you to review at your convenience later.  YOU SHOULD EXPECT: Some feelings of bloating in the abdomen. Passage of more gas than usual.  Walking can help get rid of the air that was put into your GI tract during the procedure and reduce the bloating. If you had a lower endoscopy (such as a colonoscopy or flexible sigmoidoscopy) you may notice spotting of blood in your stool or on the toilet paper. If you underwent a bowel prep for your procedure, then you may not have a normal bowel movement for a few days.  DIET: Your first meal following the procedure should be a light meal and then it is ok to progress to your normal diet.  A half-sandwich or bowl of soup is an example of a good first meal.  Heavy or fried foods are harder to digest and may make you feel nauseous or bloated.  Likewise meals heavy in dairy and vegetables can cause extra gas to form and this can also increase the bloating.  Drink plenty of fluids but you should avoid alcoholic beverages for 24 hours.  ACTIVITY: Your care partner should take you home directly after the procedure.  You should plan to take it easy, moving slowly for the rest of the day.  You can resume normal activity the day after the procedure however you should NOT DRIVE or use heavy machinery for 24 hours (because of the sedation medicines used during the test).    SYMPTOMS TO REPORT IMMEDIATELY: A gastroenterologist can be reached at any hour.  During normal business hours, 8:30 AM to 5:00 PM Monday through Friday,  call [phone].  After hours and on weekends, please call the GI answering service at [phone] who will take a message and have the physician on call contact you.   Following lower endoscopy (colonoscopy or flexible sigmoidoscopy):  Excessive amounts of blood in the stool  Significant tenderness or worsening of abdominal pains  Swelling of the abdomen that is new, acute  Fever of 100F or higher  FOLLOW UP: If any biopsies were taken you will be contacted by phone or by letter within the next 1-3 weeks.  Call your gastroenterologist if you have not heard about the biopsies in 3 weeks.  Our staff will call the home number listed on your records the next business day following your procedure to check on you and address any questions or concerns that you may have at that time regarding the information given to you following your procedure. This is a courtesy call and so if there is no answer at the home number and we have not heard from you through the emergency physician on call, we will assume that you have returned to your regular daily activities without incident.  SIGNATURES/CONFIDENTIALITY: You and/or your care partner have signed paperwork which will be entered into your electronic medical record.  These signatures attest to the fact that that the information above on your After Visit Summary has been reviewed and is understood.  Full responsibility of the confidentiality of this   discharge information lies with you and/or your care-partner.  Recommendations Await pathology results High fiber diet

## 2014-08-25 NOTE — Progress Notes (Signed)
Called to room to assist during endoscopic procedure.  Patient ID and intended procedure confirmed with present staff. Received instructions for my participation in the procedure from the performing physician.  

## 2014-08-26 ENCOUNTER — Telehealth: Payer: Self-pay

## 2014-08-26 NOTE — Telephone Encounter (Signed)
  Follow up Call-  Call back number 08/25/2014  Post procedure Call Back phone  # [phone]- work or [phone] home  Permission to leave phone message Yes     Patient questions:  Do you have a fever, pain , or abdominal swelling? No. Pain Score  0 *  Have you tolerated food without any problems? Yes.    Have you been able to return to your normal activities? Yes.    Do you have any questions about your discharge instructions: Diet   No. Medications  No. Follow up visit  No.  Do you have questions or concerns about your Care? No.  Actions: * If pain score is 4 or above: No action needed, pain <4.

## 2014-08-31 ENCOUNTER — Encounter: Payer: Self-pay | Admitting: Internal Medicine

## 2014-09-01 ENCOUNTER — Encounter: Payer: Self-pay | Admitting: *Deleted

## 2015-07-25 ENCOUNTER — Other Ambulatory Visit: Payer: Self-pay

## 2015-07-25 DIAGNOSIS — Z1231 Encounter for screening mammogram for malignant neoplasm of breast: Secondary | ICD-10-CM

## 2015-08-18 ENCOUNTER — Ambulatory Visit
Admission: RE | Admit: 2015-08-18 | Discharge: 2015-08-18 | Disposition: A | Discharge status: Home / Self Care | Payer: 59 | Source: Ambulatory Visit

## 2015-08-18 DIAGNOSIS — Z1231 Encounter for screening mammogram for malignant neoplasm of breast: Secondary | ICD-10-CM

## 2016-01-05 MED FILL — LISINOPRIL-HCTZ 10-12.5 MG: 10-12.5 | 90 days supply | Qty: 90 | Fill #1

## 2016-01-25 DIAGNOSIS — M19071 Primary osteoarthritis, right ankle and foot: Secondary | ICD-10-CM | POA: Diagnosis not present

## 2016-01-25 DIAGNOSIS — M25571 Pain in right ankle and joints of right foot: Secondary | ICD-10-CM | POA: Diagnosis not present

## 2016-01-25 DIAGNOSIS — M2011 Hallux valgus (acquired), right foot: Secondary | ICD-10-CM | POA: Diagnosis not present

## 2016-02-21 MED FILL — COUGH DM ER 30 MG/5 ML SUSP: 30 | 4 days supply | Qty: 89 | Fill #0

## 2016-02-21 MED FILL — SM FEXOFENADINE 180 MG TAB: 180 | 30 days supply | Qty: 30 | Fill #0

## 2016-03-23 DIAGNOSIS — Z0000 Encounter for general adult medical examination without abnormal findings: Secondary | ICD-10-CM | POA: Diagnosis not present

## 2016-03-23 DIAGNOSIS — I10 Essential (primary) hypertension: Secondary | ICD-10-CM | POA: Diagnosis not present

## 2016-03-23 DIAGNOSIS — N959 Unspecified menopausal and perimenopausal disorder: Secondary | ICD-10-CM | POA: Diagnosis not present

## 2016-03-23 DIAGNOSIS — E782 Mixed hyperlipidemia: Secondary | ICD-10-CM | POA: Diagnosis not present

## 2016-03-23 DIAGNOSIS — M159 Polyosteoarthritis, unspecified: Secondary | ICD-10-CM | POA: Diagnosis not present

## 2016-03-23 DIAGNOSIS — Z853 Personal history of malignant neoplasm of breast: Secondary | ICD-10-CM | POA: Diagnosis not present

## 2016-03-23 DIAGNOSIS — Z23 Encounter for immunization: Secondary | ICD-10-CM | POA: Diagnosis not present

## 2016-03-23 DIAGNOSIS — J309 Allergic rhinitis, unspecified: Secondary | ICD-10-CM | POA: Diagnosis not present

## 2016-03-23 DIAGNOSIS — R739 Hyperglycemia, unspecified: Secondary | ICD-10-CM | POA: Diagnosis not present

## 2016-03-26 ENCOUNTER — Other Ambulatory Visit: Payer: Self-pay | Admitting: Family Medicine

## 2016-03-26 ENCOUNTER — Other Ambulatory Visit: Payer: Self-pay

## 2016-03-26 DIAGNOSIS — Z1231 Encounter for screening mammogram for malignant neoplasm of breast: Secondary | ICD-10-CM

## 2016-03-26 DIAGNOSIS — E2839 Other primary ovarian failure: Secondary | ICD-10-CM

## 2016-04-18 MED FILL — LISINOPRIL-HCTZ 10-12.5 MG: 10-12.5 | 90 days supply | Qty: 90 | Fill #0

## 2016-06-27 DIAGNOSIS — H52223 Regular astigmatism, bilateral: Secondary | ICD-10-CM | POA: Diagnosis not present

## 2016-06-27 DIAGNOSIS — H35031 Hypertensive retinopathy, right eye: Secondary | ICD-10-CM | POA: Diagnosis not present

## 2016-06-27 DIAGNOSIS — I10 Essential (primary) hypertension: Secondary | ICD-10-CM | POA: Diagnosis not present

## 2016-06-27 DIAGNOSIS — H524 Presbyopia: Secondary | ICD-10-CM | POA: Diagnosis not present

## 2016-06-27 DIAGNOSIS — H5203 Hypermetropia, bilateral: Secondary | ICD-10-CM | POA: Diagnosis not present

## 2016-06-27 DIAGNOSIS — H25813 Combined forms of age-related cataract, bilateral: Secondary | ICD-10-CM | POA: Diagnosis not present

## 2016-08-01 ENCOUNTER — Other Ambulatory Visit: Payer: Self-pay | Admitting: Family Medicine

## 2016-08-01 DIAGNOSIS — Z1231 Encounter for screening mammogram for malignant neoplasm of breast: Secondary | ICD-10-CM

## 2016-08-02 MED FILL — LISINOPRIL-HCTZ 10-12.5 MG: 10-12.5 | 90 days supply | Qty: 90 | Fill #1

## 2016-08-27 ENCOUNTER — Ambulatory Visit
Admission: RE | Admit: 2016-08-27 | Discharge: 2016-08-27 | Disposition: A | Discharge status: Home / Self Care | Payer: 59 | Source: Ambulatory Visit | Attending: Family Medicine | Admitting: Family Medicine

## 2016-08-27 DIAGNOSIS — Z1231 Encounter for screening mammogram for malignant neoplasm of breast: Secondary | ICD-10-CM | POA: Diagnosis not present

## 2016-09-19 MED FILL — CELECOXIB 200 MG CAPSULE: 200 | 90 days supply | Qty: 90 | Fill #1

## 2016-09-21 DIAGNOSIS — M542 Cervicalgia: Secondary | ICD-10-CM | POA: Diagnosis not present

## 2016-09-21 DIAGNOSIS — E782 Mixed hyperlipidemia: Secondary | ICD-10-CM | POA: Diagnosis not present

## 2016-09-21 DIAGNOSIS — I10 Essential (primary) hypertension: Secondary | ICD-10-CM | POA: Diagnosis not present

## 2016-09-21 DIAGNOSIS — M159 Polyosteoarthritis, unspecified: Secondary | ICD-10-CM | POA: Diagnosis not present

## 2016-09-21 DIAGNOSIS — Z853 Personal history of malignant neoplasm of breast: Secondary | ICD-10-CM | POA: Diagnosis not present

## 2016-09-21 DIAGNOSIS — R739 Hyperglycemia, unspecified: Secondary | ICD-10-CM | POA: Diagnosis not present

## 2016-09-21 DIAGNOSIS — J309 Allergic rhinitis, unspecified: Secondary | ICD-10-CM | POA: Diagnosis not present

## 2016-11-12 MED FILL — LISINOPRIL-HCTZ 10-12.5 MG: 10-12.5 | 90 days supply | Qty: 90 | Fill #0

## 2017-02-22 MED FILL — LISINOPRIL-HCTZ 10-12.5 MG: 10-12.5 | 90 days supply | Qty: 90 | Fill #1

## 2017-03-25 DIAGNOSIS — R739 Hyperglycemia, unspecified: Secondary | ICD-10-CM | POA: Diagnosis not present

## 2017-03-25 DIAGNOSIS — I10 Essential (primary) hypertension: Secondary | ICD-10-CM | POA: Diagnosis not present

## 2017-03-25 DIAGNOSIS — M160 Bilateral primary osteoarthritis of hip: Secondary | ICD-10-CM | POA: Diagnosis not present

## 2017-03-25 DIAGNOSIS — Z0000 Encounter for general adult medical examination without abnormal findings: Secondary | ICD-10-CM | POA: Diagnosis not present

## 2017-03-25 DIAGNOSIS — J309 Allergic rhinitis, unspecified: Secondary | ICD-10-CM | POA: Diagnosis not present

## 2017-03-25 DIAGNOSIS — Z853 Personal history of malignant neoplasm of breast: Secondary | ICD-10-CM | POA: Diagnosis not present

## 2017-03-25 DIAGNOSIS — N959 Unspecified menopausal and perimenopausal disorder: Secondary | ICD-10-CM | POA: Diagnosis not present

## 2017-03-25 DIAGNOSIS — E782 Mixed hyperlipidemia: Secondary | ICD-10-CM | POA: Diagnosis not present

## 2017-03-25 DIAGNOSIS — M159 Polyosteoarthritis, unspecified: Secondary | ICD-10-CM | POA: Diagnosis not present

## 2017-03-25 DIAGNOSIS — M542 Cervicalgia: Secondary | ICD-10-CM | POA: Diagnosis not present

## 2017-04-26 ENCOUNTER — Encounter: Payer: 59 | Attending: Family Medicine | Admitting: Registered"

## 2017-04-26 ENCOUNTER — Encounter: Payer: Self-pay | Admitting: Registered"

## 2017-04-26 DIAGNOSIS — E119 Type 2 diabetes mellitus without complications: Secondary | ICD-10-CM | POA: Diagnosis not present

## 2017-04-26 DIAGNOSIS — Z713 Dietary counseling and surveillance: Secondary | ICD-10-CM | POA: Insufficient documentation

## 2017-04-26 NOTE — Patient Instructions (Addendum)
   Ideas to get potassium in your diet: black beans, potatoes, sweet potatoes, beets, spinach   Keep using beans and chick peas in your diet  Plan:   Aim for 2-3 Carb Choices per meal (30-45 grams) +/- 1 either way   Aim for 0-1 Carbs per snack if hungry   Include protein in moderation with your meals and snacks  Consider eating a small handful of nuts each day  Consider reading food labels for Total Carbohydrate and Sat Fat Grams of foods  Read the smoothie nutrition facts label to make sure the grams are around 15 and include a protein if less than 5 grams in the serving  Consider increasing your activity level daily as tolerated

## 2017-04-26 NOTE — Progress Notes (Signed)
Diabetes Self-Management Education  Visit Type: First/Initial  Appt. Start Time: 0945 Appt. End Time: 1115  04/26/2017  Ms. Stacey Patel, identified by name and date of birth, is a 66 y.o. female with a diagnosis of Diabetes: Type 2.   ASSESSMENT Pt states she gave up soda 6 months ago and started drinking mineral water. Pt reports Dr. Leonides Schanz told her not to have too much mineral water. Pt states she recently increased her physical activity and will walk during breaks at work and will walk around the parking lot before leaving work.  Pt states she was drinking orange juice to get potassium in her diet. RD suggested other foods, all of which patients said she really enjoys.      Diabetes Self-Management Education - 04/26/17 0948      Visit Information   Visit Type First/Initial     Initial Visit   Diabetes Type Type 2   Are you currently following a meal plan? Yes   What type of meal plan do you follow? tries to eat healthy   Are you taking your medications as prescribed? Yes     Psychosocial Assessment   How often do you need to have someone help you when you read instructions, pamphlets, or other written materials from your doctor or pharmacy? 1 - Never   What is the last grade level you completed in school? 1 year of college     Complications   Last HgB A1C per patient/outside source 6.7 %  per chart 03/25/17   How often do you check your blood sugar? 0 times/day (not testing)   Have you had a dilated eye exam in the past 12 months? Yes   Have you had a dental exam in the past 12 months? Yes   Are you checking your feet? Yes   How many days per week are you checking your feet? 7     Dietary Intake   Breakfast oatmeal, black pepper   Snack (morning) noen OR candy, cheetoes   Lunch green beans and tuna OR tuna and salad   Snack (afternoon) none OR cheetos   Dinner fried chicken, green beans and potatoes   Snack (evening) smoothie (pinacolata)   Beverage(s) mineral water,  (no soda any more)     Exercise   Exercise Type Light (walking / raking leaves)   How many days per week to you exercise? 5   How many minutes per day do you exercise? 10   Total minutes per week of exercise 50     Patient Education   Previous Diabetes Education No   Disease state  Definition of diabetes, type 1 and 2, and the diagnosis of diabetes;Factors that contribute to the development of diabetes   Nutrition management  Role of diet in the treatment of diabetes and the relationship between the three main macronutrients and blood glucose level;Food label reading, portion sizes and measuring food.;Carbohydrate counting   Physical activity and exercise  Role of exercise on diabetes management, blood pressure control and cardiac health.   Monitoring Identified appropriate SMBG and/or A1C goals.   Chronic complications Relationship between chronic complications and blood glucose control;Assessed and discussed foot care and prevention of foot problems;Retinopathy and reason for yearly dilated eye exams     Individualized Goals (developed by patient)   Nutrition Follow meal plan discussed   Physical Activity Exercise 5-7 days per week     Outcomes   Expected Outcomes Demonstrated interest in learning. Expect positive outcomes  Future DMSE PRN   Program Status Completed    Individualized Plan for Diabetes Self-Management Training:   Learning Objective:  Patient will have a greater understanding of diabetes self-management. Patient education plan is to attend individual and/or group sessions per assessed needs and concerns.  Patient Instructions   Ideas to get potassium in your diet: black beans, potatoes, sweet potatoes, beets, spinach   Keep using beans and chick peas in your diet  Plan:   Aim for 2-3 Carb Choices per meal (30-45 grams) +/- 1 either way   Aim for 0-1 Carbs per snack if hungry   Include protein in moderation with your meals and snacks  Consider eating a  small handful of nuts each day  Consider reading food labels for Total Carbohydrate and Sat Fat Grams of foods  Read the smoothie nutrition facts label to make sure the grams are around 15 and include a protein if less than 5 grams in the serving  Consider increasing your activity level daily as tolerated   Expected Outcomes:  Demonstrated interest in learning. Expect positive outcomes  Education material provided: Living Well with Diabetes, A1C conversion sheet, My Plate and Snack sheet  If problems or questions, patient to contact team via:  Phone and MyChart  Future DSME appointment: PRN

## 2017-05-07 DIAGNOSIS — N958 Other specified menopausal and perimenopausal disorders: Secondary | ICD-10-CM | POA: Diagnosis not present

## 2017-05-31 MED FILL — LISINOPRIL-HCTZ 10-12.5 MG: 10-12.5 | 90 days supply | Qty: 90 | Fill #2

## 2017-06-28 DIAGNOSIS — H5203 Hypermetropia, bilateral: Secondary | ICD-10-CM | POA: Diagnosis not present

## 2017-06-28 DIAGNOSIS — H52223 Regular astigmatism, bilateral: Secondary | ICD-10-CM | POA: Diagnosis not present

## 2017-06-28 DIAGNOSIS — H25813 Combined forms of age-related cataract, bilateral: Secondary | ICD-10-CM | POA: Diagnosis not present

## 2017-07-22 DIAGNOSIS — Z853 Personal history of malignant neoplasm of breast: Secondary | ICD-10-CM | POA: Diagnosis not present

## 2017-08-01 DIAGNOSIS — Z853 Personal history of malignant neoplasm of breast: Secondary | ICD-10-CM | POA: Diagnosis not present

## 2017-08-27 ENCOUNTER — Other Ambulatory Visit: Payer: Self-pay | Admitting: Family Medicine

## 2017-08-27 DIAGNOSIS — Z1239 Encounter for other screening for malignant neoplasm of breast: Secondary | ICD-10-CM

## 2017-09-09 ENCOUNTER — Ambulatory Visit
Admission: RE | Admit: 2017-09-09 | Discharge: 2017-09-09 | Disposition: A | Discharge status: Home / Self Care | Payer: 59 | Source: Ambulatory Visit | Attending: Family Medicine | Admitting: Family Medicine

## 2017-09-09 DIAGNOSIS — Z1231 Encounter for screening mammogram for malignant neoplasm of breast: Secondary | ICD-10-CM | POA: Diagnosis not present

## 2017-09-09 DIAGNOSIS — Z1239 Encounter for other screening for malignant neoplasm of breast: Secondary | ICD-10-CM

## 2017-09-09 HISTORY — DX: Personal history of irradiation: Z92.3

## 2017-09-12 MED FILL — DICLOFENAC SODIUM 1% GEL: 1 | 13 days supply | Qty: 100 | Fill #0

## 2017-09-12 MED FILL — LISINOPRIL-HCTZ 10-12.5 MG: 10-12.5 | 90 days supply | Qty: 90 | Fill #3

## 2017-09-26 DIAGNOSIS — E782 Mixed hyperlipidemia: Secondary | ICD-10-CM | POA: Diagnosis not present

## 2017-09-26 DIAGNOSIS — E119 Type 2 diabetes mellitus without complications: Secondary | ICD-10-CM | POA: Diagnosis not present

## 2017-09-26 DIAGNOSIS — I10 Essential (primary) hypertension: Secondary | ICD-10-CM | POA: Diagnosis not present

## 2017-09-26 DIAGNOSIS — Z1159 Encounter for screening for other viral diseases: Secondary | ICD-10-CM | POA: Diagnosis not present

## 2017-09-26 DIAGNOSIS — J309 Allergic rhinitis, unspecified: Secondary | ICD-10-CM | POA: Diagnosis not present

## 2017-09-26 DIAGNOSIS — M542 Cervicalgia: Secondary | ICD-10-CM | POA: Diagnosis not present

## 2017-09-26 DIAGNOSIS — M159 Polyosteoarthritis, unspecified: Secondary | ICD-10-CM | POA: Diagnosis not present

## 2017-12-13 DIAGNOSIS — Z853 Personal history of malignant neoplasm of breast: Secondary | ICD-10-CM | POA: Diagnosis not present

## 2017-12-23 MED FILL — LISINOPRIL-HCTZ 10-12.5 MG: 10-12.5 | 90 days supply | Qty: 90 | Fill #0

## 2018-03-31 DIAGNOSIS — M542 Cervicalgia: Secondary | ICD-10-CM | POA: Diagnosis not present

## 2018-03-31 DIAGNOSIS — E1169 Type 2 diabetes mellitus with other specified complication: Secondary | ICD-10-CM | POA: Diagnosis not present

## 2018-03-31 DIAGNOSIS — Z853 Personal history of malignant neoplasm of breast: Secondary | ICD-10-CM | POA: Diagnosis not present

## 2018-03-31 DIAGNOSIS — I10 Essential (primary) hypertension: Secondary | ICD-10-CM | POA: Diagnosis not present

## 2018-03-31 DIAGNOSIS — E782 Mixed hyperlipidemia: Secondary | ICD-10-CM | POA: Diagnosis not present

## 2018-03-31 DIAGNOSIS — M159 Polyosteoarthritis, unspecified: Secondary | ICD-10-CM | POA: Diagnosis not present

## 2018-03-31 DIAGNOSIS — Z0001 Encounter for general adult medical examination with abnormal findings: Secondary | ICD-10-CM | POA: Diagnosis not present

## 2018-03-31 DIAGNOSIS — J309 Allergic rhinitis, unspecified: Secondary | ICD-10-CM | POA: Diagnosis not present

## 2018-03-31 DIAGNOSIS — Z1159 Encounter for screening for other viral diseases: Secondary | ICD-10-CM | POA: Diagnosis not present

## 2018-04-01 MED FILL — LISINOPRIL-HCTZ 10-12.5 MG: 10-12.5 | 90 days supply | Qty: 90 | Fill #0

## 2018-04-01 MED FILL — ROSUVASTATIN CALCIUM 10 MG: 10 | 84 days supply | Qty: 12 | Fill #0

## 2018-04-01 MED FILL — DICLOFENAC SODIUM 1% GEL: 1 | 90 days supply | Qty: 300 | Fill #0

## 2018-04-19 DIAGNOSIS — M79671 Pain in right foot: Secondary | ICD-10-CM | POA: Diagnosis not present

## 2018-04-19 DIAGNOSIS — M19071 Primary osteoarthritis, right ankle and foot: Secondary | ICD-10-CM | POA: Diagnosis not present

## 2018-07-14 MED FILL — LISINOPRIL-HCTZ 10-12.5 MG: 10-12.5 | 90 days supply | Qty: 90 | Fill #1

## 2018-08-14 DIAGNOSIS — H5203 Hypermetropia, bilateral: Secondary | ICD-10-CM | POA: Diagnosis not present

## 2018-08-14 DIAGNOSIS — H40009 Preglaucoma, unspecified, unspecified eye: Secondary | ICD-10-CM | POA: Diagnosis not present

## 2018-08-14 DIAGNOSIS — H04203 Unspecified epiphora, bilateral lacrimal glands: Secondary | ICD-10-CM | POA: Diagnosis not present

## 2018-08-14 DIAGNOSIS — H25813 Combined forms of age-related cataract, bilateral: Secondary | ICD-10-CM | POA: Diagnosis not present

## 2018-08-14 DIAGNOSIS — H11153 Pinguecula, bilateral: Secondary | ICD-10-CM | POA: Diagnosis not present

## 2018-08-14 DIAGNOSIS — H52223 Regular astigmatism, bilateral: Secondary | ICD-10-CM | POA: Diagnosis not present

## 2018-08-14 DIAGNOSIS — H40013 Open angle with borderline findings, low risk, bilateral: Secondary | ICD-10-CM | POA: Diagnosis not present

## 2018-09-17 ENCOUNTER — Other Ambulatory Visit: Payer: Self-pay | Admitting: Family Medicine

## 2018-09-17 DIAGNOSIS — Z1231 Encounter for screening mammogram for malignant neoplasm of breast: Secondary | ICD-10-CM

## 2018-09-22 DIAGNOSIS — M25561 Pain in right knee: Secondary | ICD-10-CM | POA: Diagnosis not present

## 2018-10-02 DIAGNOSIS — J309 Allergic rhinitis, unspecified: Secondary | ICD-10-CM | POA: Diagnosis not present

## 2018-10-02 DIAGNOSIS — M542 Cervicalgia: Secondary | ICD-10-CM | POA: Diagnosis not present

## 2018-10-02 DIAGNOSIS — E782 Mixed hyperlipidemia: Secondary | ICD-10-CM | POA: Diagnosis not present

## 2018-10-02 DIAGNOSIS — M159 Polyosteoarthritis, unspecified: Secondary | ICD-10-CM | POA: Diagnosis not present

## 2018-10-02 DIAGNOSIS — E1169 Type 2 diabetes mellitus with other specified complication: Secondary | ICD-10-CM | POA: Diagnosis not present

## 2018-10-02 DIAGNOSIS — Z853 Personal history of malignant neoplasm of breast: Secondary | ICD-10-CM | POA: Diagnosis not present

## 2018-10-02 DIAGNOSIS — I10 Essential (primary) hypertension: Secondary | ICD-10-CM | POA: Diagnosis not present

## 2018-10-02 MED FILL — ROSUVASTATIN CALCIUM 10 MG: 10 | 90 days supply | Qty: 13 | Fill #0

## 2018-10-02 MED FILL — LISINOPRIL-HCTZ 10-12.5 MG: 10-12.5 | 90 days supply | Qty: 90 | Fill #0

## 2018-10-13 MED FILL — FEXOFENADINE HCL 180 MG TAB: 180 | 90 days supply | Qty: 90 | Fill #0

## 2018-10-27 ENCOUNTER — Ambulatory Visit
Admission: RE | Admit: 2018-10-27 | Discharge: 2018-10-27 | Disposition: A | Discharge status: Home / Self Care | Payer: 59 | Source: Ambulatory Visit | Attending: Family Medicine | Admitting: Family Medicine

## 2018-10-27 DIAGNOSIS — Z853 Personal history of malignant neoplasm of breast: Secondary | ICD-10-CM | POA: Diagnosis not present

## 2018-10-27 DIAGNOSIS — Z1231 Encounter for screening mammogram for malignant neoplasm of breast: Secondary | ICD-10-CM | POA: Diagnosis not present

## 2018-11-21 DIAGNOSIS — M7121 Synovial cyst of popliteal space [Baker], right knee: Secondary | ICD-10-CM | POA: Diagnosis not present

## 2018-11-21 DIAGNOSIS — M25561 Pain in right knee: Secondary | ICD-10-CM | POA: Diagnosis not present

## 2019-01-20 MED FILL — LISINOPRIL-HCTZ 10-12.5 MG: 10-12.5 | 90 days supply | Qty: 90 | Fill #1

## 2019-04-13 DIAGNOSIS — E782 Mixed hyperlipidemia: Secondary | ICD-10-CM | POA: Diagnosis not present

## 2019-04-13 DIAGNOSIS — M159 Polyosteoarthritis, unspecified: Secondary | ICD-10-CM | POA: Diagnosis not present

## 2019-04-13 DIAGNOSIS — I10 Essential (primary) hypertension: Secondary | ICD-10-CM | POA: Diagnosis not present

## 2019-04-13 DIAGNOSIS — E1169 Type 2 diabetes mellitus with other specified complication: Secondary | ICD-10-CM | POA: Diagnosis not present

## 2019-04-13 DIAGNOSIS — Z853 Personal history of malignant neoplasm of breast: Secondary | ICD-10-CM | POA: Diagnosis not present

## 2019-04-13 DIAGNOSIS — J309 Allergic rhinitis, unspecified: Secondary | ICD-10-CM | POA: Diagnosis not present

## 2019-04-13 MED FILL — LISINOPRIL-HCTZ 10-12.5 MG: 10-12.5 | 90 days supply | Qty: 90 | Fill #0

## 2019-04-13 MED FILL — ROSUVASTATIN CALCIUM 10 MG: 10 | 84 days supply | Qty: 12 | Fill #0

## 2019-04-15 DIAGNOSIS — J309 Allergic rhinitis, unspecified: Secondary | ICD-10-CM | POA: Diagnosis not present

## 2019-04-15 DIAGNOSIS — I10 Essential (primary) hypertension: Secondary | ICD-10-CM | POA: Diagnosis not present

## 2019-04-15 DIAGNOSIS — M159 Polyosteoarthritis, unspecified: Secondary | ICD-10-CM | POA: Diagnosis not present

## 2019-04-15 DIAGNOSIS — Z853 Personal history of malignant neoplasm of breast: Secondary | ICD-10-CM | POA: Diagnosis not present

## 2019-04-15 DIAGNOSIS — Z0001 Encounter for general adult medical examination with abnormal findings: Secondary | ICD-10-CM | POA: Diagnosis not present

## 2019-04-15 DIAGNOSIS — E119 Type 2 diabetes mellitus without complications: Secondary | ICD-10-CM | POA: Diagnosis not present

## 2019-04-15 DIAGNOSIS — E782 Mixed hyperlipidemia: Secondary | ICD-10-CM | POA: Diagnosis not present

## 2019-04-15 DIAGNOSIS — E1169 Type 2 diabetes mellitus with other specified complication: Secondary | ICD-10-CM | POA: Diagnosis not present

## 2019-07-27 MED FILL — LISINOPRIL-HCTZ 10-12.5 MG: 10-12.5 | 30 days supply | Qty: 30 | Fill #1

## 2019-09-04 MED FILL — LISINOPRIL-HCTZ 10-12.5 MG: 10-12.5 | 60 days supply | Qty: 60 | Fill #2

## 2019-09-24 DIAGNOSIS — E782 Mixed hyperlipidemia: Secondary | ICD-10-CM | POA: Diagnosis not present

## 2019-09-24 DIAGNOSIS — Z79899 Other long term (current) drug therapy: Secondary | ICD-10-CM | POA: Diagnosis not present

## 2019-09-24 DIAGNOSIS — I10 Essential (primary) hypertension: Secondary | ICD-10-CM | POA: Diagnosis not present

## 2019-09-24 DIAGNOSIS — E1169 Type 2 diabetes mellitus with other specified complication: Secondary | ICD-10-CM | POA: Diagnosis not present

## 2019-10-22 DIAGNOSIS — J309 Allergic rhinitis, unspecified: Secondary | ICD-10-CM | POA: Diagnosis not present

## 2019-10-22 DIAGNOSIS — M159 Polyosteoarthritis, unspecified: Secondary | ICD-10-CM | POA: Diagnosis not present

## 2019-10-22 DIAGNOSIS — Z6838 Body mass index (BMI) 38.0-38.9, adult: Secondary | ICD-10-CM | POA: Diagnosis not present

## 2019-10-22 DIAGNOSIS — E1169 Type 2 diabetes mellitus with other specified complication: Secondary | ICD-10-CM | POA: Diagnosis not present

## 2019-10-22 DIAGNOSIS — Z853 Personal history of malignant neoplasm of breast: Secondary | ICD-10-CM | POA: Diagnosis not present

## 2019-10-22 DIAGNOSIS — I10 Essential (primary) hypertension: Secondary | ICD-10-CM | POA: Diagnosis not present

## 2019-10-22 DIAGNOSIS — Z0000 Encounter for general adult medical examination without abnormal findings: Secondary | ICD-10-CM | POA: Diagnosis not present

## 2019-10-22 DIAGNOSIS — E782 Mixed hyperlipidemia: Secondary | ICD-10-CM | POA: Diagnosis not present

## 2019-10-22 DIAGNOSIS — D509 Iron deficiency anemia, unspecified: Secondary | ICD-10-CM | POA: Diagnosis not present

## 2019-10-22 MED FILL — ROSUVASTATIN CALCIUM 10 MG: 10 | 84 days supply | Qty: 12 | Fill #0

## 2019-10-23 DIAGNOSIS — D509 Iron deficiency anemia, unspecified: Secondary | ICD-10-CM | POA: Diagnosis not present

## 2019-10-27 MED FILL — LISINOPRIL-HCTZ 10-12.5 MG: 10-12.5 | 90 days supply | Qty: 90 | Fill #0

## 2019-10-29 ENCOUNTER — Other Ambulatory Visit: Payer: Self-pay | Admitting: Family Medicine

## 2019-10-29 ENCOUNTER — Other Ambulatory Visit: Payer: Self-pay

## 2019-10-29 ENCOUNTER — Ambulatory Visit
Admission: RE | Admit: 2019-10-29 | Discharge: 2019-10-29 | Disposition: A | Discharge status: Home / Self Care | Payer: 59 | Source: Ambulatory Visit | Attending: Family Medicine | Admitting: Family Medicine

## 2019-10-29 DIAGNOSIS — Z1231 Encounter for screening mammogram for malignant neoplasm of breast: Secondary | ICD-10-CM

## 2019-11-02 ENCOUNTER — Other Ambulatory Visit: Payer: Self-pay | Admitting: Family Medicine

## 2019-11-02 DIAGNOSIS — R928 Other abnormal and inconclusive findings on diagnostic imaging of breast: Secondary | ICD-10-CM

## 2019-11-04 ENCOUNTER — Other Ambulatory Visit: Payer: Self-pay

## 2019-11-04 ENCOUNTER — Ambulatory Visit: Payer: 59

## 2019-11-04 ENCOUNTER — Ambulatory Visit
Admission: RE | Admit: 2019-11-04 | Discharge: 2019-11-04 | Disposition: A | Discharge status: Home / Self Care | Payer: 59 | Source: Ambulatory Visit | Attending: Family Medicine | Admitting: Family Medicine

## 2019-11-04 DIAGNOSIS — R928 Other abnormal and inconclusive findings on diagnostic imaging of breast: Secondary | ICD-10-CM | POA: Diagnosis not present

## 2019-11-16 MED FILL — LISINOPRIL-HCTZ 10-12.5 MG: 10-12.5 | 90 days supply | Qty: 90 | Fill #0

## 2019-12-23 ENCOUNTER — Ambulatory Visit: Payer: 59

## 2020-03-02 DIAGNOSIS — H25813 Combined forms of age-related cataract, bilateral: Secondary | ICD-10-CM | POA: Diagnosis not present

## 2020-03-02 DIAGNOSIS — H524 Presbyopia: Secondary | ICD-10-CM | POA: Diagnosis not present

## 2020-03-02 DIAGNOSIS — H5203 Hypermetropia, bilateral: Secondary | ICD-10-CM | POA: Diagnosis not present

## 2020-03-02 DIAGNOSIS — H52223 Regular astigmatism, bilateral: Secondary | ICD-10-CM | POA: Diagnosis not present

## 2020-03-02 DIAGNOSIS — H40053 Ocular hypertension, bilateral: Secondary | ICD-10-CM | POA: Diagnosis not present

## 2020-03-02 DIAGNOSIS — H25013 Cortical age-related cataract, bilateral: Secondary | ICD-10-CM | POA: Diagnosis not present

## 2020-03-02 DIAGNOSIS — H40019 Open angle with borderline findings, low risk, unspecified eye: Secondary | ICD-10-CM | POA: Diagnosis not present

## 2020-03-02 DIAGNOSIS — H40059 Ocular hypertension, unspecified eye: Secondary | ICD-10-CM | POA: Diagnosis not present

## 2020-03-02 DIAGNOSIS — H40013 Open angle with borderline findings, low risk, bilateral: Secondary | ICD-10-CM | POA: Diagnosis not present

## 2020-03-08 MED FILL — LISINOPRIL-HCTZ 10-12.5 MG: 10-12.5 | 90 days supply | Qty: 90 | Fill #1

## 2020-03-18 DIAGNOSIS — M7661 Achilles tendinitis, right leg: Secondary | ICD-10-CM | POA: Diagnosis not present

## 2020-03-18 DIAGNOSIS — M19071 Primary osteoarthritis, right ankle and foot: Secondary | ICD-10-CM | POA: Diagnosis not present

## 2020-03-18 DIAGNOSIS — M25571 Pain in right ankle and joints of right foot: Secondary | ICD-10-CM | POA: Diagnosis not present

## 2020-05-23 DIAGNOSIS — M7661 Achilles tendinitis, right leg: Secondary | ICD-10-CM | POA: Diagnosis not present

## 2020-06-02 ENCOUNTER — Ambulatory Visit (HOSPITAL_COMMUNITY)
Admission: EM | Admit: 2020-06-02 | Discharge: 2020-06-02 | Disposition: A | Discharge status: Home / Self Care | Payer: 59 | Attending: Family Medicine | Admitting: Family Medicine

## 2020-06-02 ENCOUNTER — Encounter (HOSPITAL_COMMUNITY): Payer: Self-pay

## 2020-06-02 DIAGNOSIS — S0501XA Injury of conjunctiva and corneal abrasion without foreign body, right eye, initial encounter: Secondary | ICD-10-CM

## 2020-06-02 MED ORDER — FLUORESCEIN SODIUM 1 MG OP STRP
ORAL_STRIP | OPHTHALMIC | Status: AC
  Filled 2020-06-02: qty 1

## 2020-06-02 MED ORDER — POLYMYXIN B-TRIMETHOPRIM 10000-0.1 UNIT/ML-% OP SOLN
1.0000 [drp] | OPHTHALMIC | 0 refills | Status: DC
Start: 2020-06-02 — End: 2020-07-01

## 2020-06-02 MED ORDER — TETRACAINE HCL 0.5 % OP SOLN
OPHTHALMIC | Status: AC
  Filled 2020-06-02: qty 4

## 2020-06-02 MED ORDER — KETOROLAC TROMETHAMINE 0.5 % OP SOLN
1.0000 [drp] | Freq: Four times a day (QID) | OPHTHALMIC | 0 refills | Status: DC
Start: 2020-06-02 — End: 2020-07-01

## 2020-06-02 MED FILL — POLYMYXIN B/TMP EYE DROPS: 10000-0.1 | 30 days supply | Qty: 10 | Fill #0

## 2020-06-02 MED FILL — KETOROLAC 0.5% OPHTH SOLN: 0.5 | 25 days supply | Qty: 5 | Fill #0

## 2020-06-02 NOTE — ED Triage Notes (Signed)
Pt presents with complaints of eye swelling and tearing. Reports mild pain that has improved. States she thought she eyelash in her eye but she does not see one in there.

## 2020-06-02 NOTE — Discharge Instructions (Addendum)
Treating you for a corneal abrasion. Ketorolac eyedrops for pain every 6 hours as needed Polytrim for antibiotic coverage every 4 hours while awake. Call your ophthalmologist for follow-up

## 2020-06-02 NOTE — ED Provider Notes (Signed)
[facility]    CSN: [id number] Arrival date & time: 06/02/20  1443      History   Chief Complaint Chief Complaint  Patient presents with  . Eye Problem    HPI Stacey Patel is a 69 y.o. female.   Pt presents with right eye redness, irritation, and pain beginning yesterday. Reports noting possible tear in contact. Denies being around anyone who is sick. Denies visual disturbances. Hx possible cataracts.   ROS per HPI      Past Medical History:  Diagnosis Date  . Allergy    seasonal  . Breast cancer ([place]) 2000   left   . Hyperlipidemia   . Hypertension   . Personal history of radiation therapy 2000    Patient Active Problem List   Diagnosis Date Noted  . Breast cancer ([place]) 10/24/2012    Past Surgical History:  Procedure Laterality Date  . ABDOMINAL HYSTERECTOMY  1998  . BREAST LUMPECTOMY Left 2000  . BREAST LUMPECTOMY WITH AXILLARY LYMPH NODE DISSECTION Left 2000  . CARPAL TUNNEL RELEASE Bilateral 1999  . NASAL RECONSTRUCTION  2008    OB History   No obstetric history on file.      Home Medications    Prior to Admission medications   Medication Sig Start Date End Date Taking? Authorizing Provider  aspirin 81 MG tablet Take 81 mg by mouth daily.   Yes [provider]  celecoxib (CELEBREX) 200 MG capsule Take 200 mg by mouth as needed.   Yes [provider]  Cholecalciferol (VITAMIN D-3 PO) Take by mouth.   Yes [provider]  Cholecalciferol (VITAMIN D-3) 5000 UNITS TABS Take 1 tablet by mouth.   Yes [provider]  cyclobenzaprine (FLEXERIL) 10 MG tablet Take 10 mg by mouth 3 (three) times daily as needed for muscle spasms.   Yes [provider]  fish oil-omega-3 fatty acids 1000 MG capsule Take 1 g by mouth daily.   Yes [provider]  Flaxseed, Linseed, (FLAXSEED OIL) 1000 MG CAPS Take 1 capsule by mouth daily.   Yes [provider]    lisinopril-hydrochlorothiazide (PRINZIDE,ZESTORETIC) 10-12.5 MG per tablet Take 1 tablet by mouth daily.   Yes [provider]  loratadine (CLARITIN) 10 MG tablet Take 10 mg by mouth daily as needed.   Yes [provider]  rosuvastatin (CRESTOR) 10 MG tablet Take 10 mg by mouth once a week.   Yes [provider]  vitamin E 400 UNIT capsule Take 400 Units by mouth daily.   Yes [provider]  ketorolac (ACULAR) 0.5 % ophthalmic solution Place 1 drop into the right eye every 6 (six) hours. 06/02/20   Loura Halt A, NP  NONFORMULARY OR COMPOUNDED ITEM Take 1 tablet by mouth daily. KappArest dietary supplement    [provider]  trimethoprim-polymyxin b (POLYTRIM) ophthalmic solution Place 1 drop into the right eye every 4 (four) hours. 06/02/20   Orvan July, NP    Family History Family History  Problem Relation Age of Onset  . Hypertension Mother   . Cancer Father   . Colon cancer Neg Hx     Social History Social History   Tobacco Use  . Smoking status: Never Smoker  . Smokeless tobacco: Never Used  Substance Use Topics  . Alcohol use: Yes    Comment: wine/beer  . Drug use: No     Allergies   Patient has no known allergies.   Review of Systems  Review of Systems  Constitutional: Negative.   Eyes: Positive for pain and redness. Negative for discharge, itching and visual disturbance.     Physical Exam Triage Vital Signs ED Triage Vitals  Enc Vitals Group     BP 06/02/20 1039 135/78     Pulse Rate 06/02/20 1039 93     Resp 06/02/20 1039 18     Temp 06/02/20 1039 97.9 F (36.6 C)     Temp src --      SpO2 06/02/20 1039 98 %     Weight --      Height --      Head Circumference --      Peak Flow --      Pain Score 06/02/20 1036 2     Pain Loc --      Pain Edu? --      Excl. in [place]? --    No data found.  Updated Vital Signs BP 135/78   Pulse 93   Temp 97.9 F (36.6 C)   Resp 18   SpO2 98%   Visual  Acuity Right Eye Distance: 20/100 Left Eye Distance: 20/40 Bilateral Distance: 20/40  Right Eye Near:   Left Eye Near:    Bilateral Near:     Physical Exam Constitutional:      Appearance: Normal appearance. She is normal weight.  HENT:     Head: Normocephalic.  Eyes:     General: No visual field deficit.    Extraocular Movements: Extraocular movements intact.     Pupils: Pupils are equal, round, and reactive to light.     Right eye: Pupil is round and reactive. Corneal abrasion present.     Slit lamp exam:    Right eye: No foreign body.     Visual Fields: Right eye visual fields normal and left eye visual fields normal.   Neurological:     Mental Status: She is alert.        UC Treatments / Results  Labs (all labs ordered are listed, but only abnormal results are displayed) Labs Reviewed - No data to display  EKG   Radiology No results found.  Procedures Procedures (including critical care time)  Medications Ordered in UC Medications - No data to display  Initial Impression / Assessment and Plan / UC Course  I have reviewed the triage vital signs and the nursing notes.  Pertinent labs & imaging results that were available during my care of the patient were reviewed by me and considered in my medical decision making (see chart for details).     Corneal abrasion to the right eye Fluorescein stain done here today. Picture noted in chart Patient having no visual disturbances at this time Prescribing antibiotic eyedrops and control eyedrops for pain Patient plans to see her ophthalmologist this week.   Final Clinical Impressions(s) / UC Diagnoses   Final diagnoses:  Abrasion of right cornea, initial encounter     Discharge Instructions     Treating you for a corneal abrasion. Ketorolac eyedrops for pain every 6 hours as needed Polytrim for antibiotic coverage every 4 hours while awake. Call your ophthalmologist for follow-up   ED Prescriptions     Medication Sig Dispense Auth. Provider   ketorolac (ACULAR) 0.5 % ophthalmic solution Place 1 drop into the right eye every 6 (six) hours. 3 mL Zanylah Hardie A, NP   trimethoprim-polymyxin b (POLYTRIM) ophthalmic solution Place 1 drop into the right eye every 4 (four) hours. 10 mL Rozanna Box,  Shamiya Demeritt A, NP     PDMP not reviewed this encounter.   Orvan July, NP 06/02/20 1157

## 2020-06-03 DIAGNOSIS — H5203 Hypermetropia, bilateral: Secondary | ICD-10-CM | POA: Diagnosis not present

## 2020-06-03 DIAGNOSIS — H16011 Central corneal ulcer, right eye: Secondary | ICD-10-CM | POA: Diagnosis not present

## 2020-06-03 DIAGNOSIS — H524 Presbyopia: Secondary | ICD-10-CM | POA: Diagnosis not present

## 2020-06-03 DIAGNOSIS — H16031 Corneal ulcer with hypopyon, right eye: Secondary | ICD-10-CM | POA: Diagnosis not present

## 2020-06-03 DIAGNOSIS — H52223 Regular astigmatism, bilateral: Secondary | ICD-10-CM | POA: Diagnosis not present

## 2020-06-03 MED FILL — MOXIFLOXACIN HCL 0.5 % SOLN: 0.5 | 5 days supply | Qty: 3 | Fill #0

## 2020-06-03 MED FILL — CYCLOPENTOLATE 1% EYE DROPS: 1 | 15 days supply | Qty: 2 | Fill #0

## 2020-06-06 DIAGNOSIS — H16031 Corneal ulcer with hypopyon, right eye: Secondary | ICD-10-CM | POA: Diagnosis not present

## 2020-06-07 MED FILL — MOXIFLOXACIN HCL 0.5 % SOLN: 0.5 | 5 days supply | Qty: 3 | Fill #1

## 2020-06-13 ENCOUNTER — Other Ambulatory Visit (HOSPITAL_COMMUNITY): Payer: Self-pay | Admitting: Ophthalmology

## 2020-06-13 DIAGNOSIS — H16031 Corneal ulcer with hypopyon, right eye: Secondary | ICD-10-CM | POA: Diagnosis not present

## 2020-06-13 MED FILL — MOXIFLOXACIN HCL 0.5 % SOLN: 0.5 | 22 days supply | Qty: 6 | Fill #0

## 2020-06-13 MED FILL — PREDNISOLONE AC 1% EYE DROP: 1 | 18 days supply | Qty: 5 | Fill #0

## 2020-06-21 DIAGNOSIS — H16031 Corneal ulcer with hypopyon, right eye: Secondary | ICD-10-CM | POA: Diagnosis not present

## 2020-06-27 MED FILL — PREDNISOLONE AC 1% EYE DROP: 1 | 25 days supply | Qty: 5 | Fill #0

## 2020-06-28 MED FILL — LISINOPRIL-HCTZ 10-12.5 MG: 10-12.5 | 30 days supply | Qty: 30 | Fill #0

## 2020-07-01 ENCOUNTER — Ambulatory Visit (HOSPITAL_COMMUNITY)
Admission: EM | Admit: 2020-07-01 | Discharge: 2020-07-01 | Disposition: A | Discharge status: Home / Self Care | Payer: 59 | Attending: Urgent Care | Admitting: Urgent Care

## 2020-07-01 ENCOUNTER — Encounter (HOSPITAL_COMMUNITY): Payer: Self-pay

## 2020-07-01 ENCOUNTER — Other Ambulatory Visit: Payer: Self-pay

## 2020-07-01 ENCOUNTER — Ambulatory Visit (INDEPENDENT_AMBULATORY_CARE_PROVIDER_SITE_OTHER): Payer: 59

## 2020-07-01 DIAGNOSIS — M25551 Pain in right hip: Secondary | ICD-10-CM

## 2020-07-01 DIAGNOSIS — R102 Pelvic and perineal pain: Secondary | ICD-10-CM

## 2020-07-01 DIAGNOSIS — R1031 Right lower quadrant pain: Secondary | ICD-10-CM | POA: Insufficient documentation

## 2020-07-01 DIAGNOSIS — R1030 Lower abdominal pain, unspecified: Secondary | ICD-10-CM | POA: Diagnosis not present

## 2020-07-01 DIAGNOSIS — M1611 Unilateral primary osteoarthritis, right hip: Secondary | ICD-10-CM | POA: Diagnosis not present

## 2020-07-01 DIAGNOSIS — M5136 Other intervertebral disc degeneration, lumbar region: Secondary | ICD-10-CM | POA: Diagnosis not present

## 2020-07-01 DIAGNOSIS — M25451 Effusion, right hip: Secondary | ICD-10-CM | POA: Diagnosis not present

## 2020-07-01 HISTORY — DX: Calcaneal spur, right foot: M77.31

## 2020-07-01 LAB — POCT URINALYSIS DIP (DEVICE)
Glucose, UA: NEGATIVE mg/dL
Hgb urine dipstick: NEGATIVE
Ketones, ur: NEGATIVE mg/dL
Leukocytes,Ua: NEGATIVE
Nitrite: NEGATIVE
Protein, ur: 30 mg/dL — AB
Specific Gravity, Urine: 1.025 (ref 1.005–1.030)
Urobilinogen, UA: 0.2 mg/dL (ref 0.0–1.0)
pH: 7 (ref 5.0–8.0)

## 2020-07-01 MED ORDER — ACETAMINOPHEN 325 MG PO TABS
650.0000 mg | ORAL_TABLET | Freq: Once | ORAL | Status: AC
  Administered 2020-07-01: 650 mg via ORAL

## 2020-07-01 MED ORDER — ACETAMINOPHEN 325 MG PO TABS
ORAL_TABLET | ORAL | Status: AC
  Filled 2020-07-01: qty 2

## 2020-07-01 MED ORDER — METHYLPREDNISOLONE ACETATE 80 MG/ML IJ SUSP
INTRAMUSCULAR | Status: AC
  Filled 2020-07-01: qty 1

## 2020-07-01 MED ORDER — METHYLPREDNISOLONE ACETATE 80 MG/ML IJ SUSP
80.0000 mg | Freq: Once | INTRAMUSCULAR | Status: AC
  Administered 2020-07-01: 80 mg via INTRAMUSCULAR

## 2020-07-01 NOTE — ED Triage Notes (Signed)
Pt c/o 8/10 sharp pain in pelvicx3 days. Pt states the pain is worse when she walks. Pt denies urinary issues. PT states she smells a hint of ammonia from wash cloth after using between her legs. Pt having a difficult time getting around and changing into a gown.

## 2020-07-01 NOTE — ED Provider Notes (Signed)
Stacey Patel   MRN: [id number] DOB: 09-Dec-1951  Subjective:   Stacey Patel is a 69 y.o. female presenting for 3-day history of acute onset moderate to severe persistent sharp right sided pelvic/groin pain.  Patient states that the pain is really aggravated with movement, bending at the hip and trying to lift her leg to walk.  Eating food does not change the nature of her pain.  Denies falls, trauma.  Denies fever, nausea, vomiting, belly pain, bloody stools, dysuria, urinary frequency, hematuria, constipation, diarrhea.   Has not tried medications for relief.  Last colonoscopy was ~10 years ago, never had an abnormal colonoscopy.  Patient has had a complete abdominal hysterectomy.   No current facility-administered medications for this encounter.  Current Outpatient Medications:  .  prednisoLONE acetate (PRED FORTE) 1 % ophthalmic suspension, 1 drop 4 (four) times daily., Disp: , Rfl:  .  aspirin 81 MG tablet, Take 81 mg by mouth daily., Disp: , Rfl:  .  celecoxib (CELEBREX) 200 MG capsule, Take 200 mg by mouth as needed., Disp: , Rfl:  .  Cholecalciferol (VITAMIN D-3 PO), Take by mouth., Disp: , Rfl:  .  Cholecalciferol (VITAMIN D-3) 5000 UNITS TABS, Take 1 tablet by mouth., Disp: , Rfl:  .  cyclobenzaprine (FLEXERIL) 10 MG tablet, Take 10 mg by mouth 3 (three) times daily as needed for muscle spasms., Disp: , Rfl:  .  fish oil-omega-3 fatty acids 1000 MG capsule, Take 1 g by mouth daily., Disp: , Rfl:  .  Flaxseed, Linseed, (FLAXSEED OIL) 1000 MG CAPS, Take 1 capsule by mouth daily., Disp: , Rfl:  .  Ibuprofen-Famotidine (DUEXIS) 800-26.6 MG TABS, 800 mg., Disp: , Rfl:  .  lisinopril-hydrochlorothiazide (PRINZIDE,ZESTORETIC) 10-12.5 MG per tablet, Take 1 tablet by mouth daily., Disp: , Rfl:  .  loratadine (CLARITIN) 10 MG tablet, Take 10 mg by mouth daily as needed., Disp: , Rfl:  .  NONFORMULARY OR COMPOUNDED ITEM, Take 1 tablet by mouth daily. KappArest dietary  supplement, Disp: , Rfl:  .  rosuvastatin (CRESTOR) 10 MG tablet, Take 10 mg by mouth once a week., Disp: , Rfl:  .  vitamin E 400 UNIT capsule, Take 400 Units by mouth daily., Disp: , Rfl:    No Known Allergies  Past Medical History:  Diagnosis Date  . Allergy    seasonal  . Breast cancer ([place]) 2000   left   . Heel spur, right   . Hyperlipidemia   . Hypertension   . Personal history of radiation therapy 2000     Past Surgical History:  Procedure Laterality Date  . ABDOMINAL HYSTERECTOMY  1998  . BREAST LUMPECTOMY Left 2000  . BREAST LUMPECTOMY WITH AXILLARY LYMPH NODE DISSECTION Left 2000  . CARPAL TUNNEL RELEASE Bilateral 1999  . NASAL RECONSTRUCTION  2008    Family History  Problem Relation Age of Onset  . Hypertension Mother   . Cancer Father   . Colon cancer Neg Hx     Social History   Tobacco Use  . Smoking status: Never Smoker  . Smokeless tobacco: Never Used  Substance Use Topics  . Alcohol use: Yes    Comment: occ  . Drug use: No    ROS   Objective:   Vitals: BP 140/83   Pulse 94   Temp 97.6 F (36.4 C) (Oral)   Resp 16   Ht 5\' 2"  (1.575 m)   Wt 198 lb (89.8 kg)   SpO2 99%   BMI  36.21 kg/m   Physical Exam Constitutional:      General: She is not in acute distress.    Appearance: Normal appearance. She is well-developed and normal weight. She is not ill-appearing, toxic-appearing or diaphoretic.  HENT:     Head: Normocephalic and atraumatic.     Right Ear: External ear normal.     Left Ear: External ear normal.     Nose: Nose normal.     Mouth/Throat:     Mouth: Mucous membranes are moist.     Pharynx: Oropharynx is clear.  Eyes:     General: No scleral icterus.    Extraocular Movements: Extraocular movements intact.     Pupils: Pupils are equal, round, and reactive to light.  Cardiovascular:     Rate and Rhythm: Normal rate and regular rhythm.     Heart sounds: Normal heart sounds. No murmur heard.  No friction rub. No gallop.    Pulmonary:     Effort: Pulmonary effort is normal. No respiratory distress.     Breath sounds: Normal breath sounds. No stridor. No wheezing, rhonchi or rales.  Abdominal:     General: Bowel sounds are normal. There is no distension.     Palpations: Abdomen is soft. There is no mass.     Tenderness: There is no abdominal tenderness. There is no right CVA tenderness, left CVA tenderness, guarding or rebound.  Musculoskeletal:     Right hip: Tenderness (Over areas outlined) and bony tenderness present. No deformity, lacerations or crepitus. Decreased range of motion. Normal strength.       Legs:     Comments: No area of erythema, warmth, swelling, sign of abscess.  Skin:    General: Skin is warm and dry.     Coloration: Skin is not pale.     Findings: No rash.  Neurological:     General: No focal deficit present.     Mental Status: She is alert and oriented to person, place, and time.  Psychiatric:        Mood and Affect: Mood normal.        Behavior: Behavior normal.        Thought Content: Thought content normal.        Judgment: Judgment normal.     DG Hip Unilat W or Wo Pelvis 2-3 Views Right  Result Date: 07/01/2020 CLINICAL DATA:  Right hip pain and pelvic pain. Pain radiates to groin and increases when moving. EXAM: DG HIP (WITH OR WITHOUT PELVIS) 2-3V RIGHT COMPARISON:  06/10/2013 FINDINGS: Moderate right hip joint effusion is noted. No fracture. No dislocation pneumonitis. Intact. Overt bony structures. Changes involving both hips. No acute fracture or dislocation. No radio-opaque foreign bodies or soft tissue calcifications. Degenerative disc disease noted within the lumbar spine. IMPRESSION: 1. No acute findings. Mild right hip osteoarthritis. Electronically Signed   By: Kerby Moors M.D.   On: 07/01/2020 11:47   Assessment and Plan :   PDMP not reviewed this encounter.  1. Pelvic pain in female   2. Right groin pain   3. Arthritis of right hip     Patient does  not have signs warranting CT scanning at this time.  She was given Tylenol in clinic which did relieve some of her pain.  Patient does have some degenerative changes of the right hip, moderate right hip joint effusion.  Recommended IM Depo-Medrol for this.  Schedule Tylenol.  Follow-up with Ortho ASAP for further evaluation including consideration of a CT or  MRI.  Recommended patient follow-up with gynecologist as well. Counseled patient on potential for adverse effects with medications prescribed/recommended today, ER and return-to-clinic precautions discussed, patient verbalized understanding.    Jaynee Eagles, PA-C 07/01/20 1400

## 2020-07-01 NOTE — Discharge Instructions (Signed)
Please just use Tylenol (acetaminophen) at a dose of 500mg -650mg  once every 6 hours as needed for your aches, pains, fevers. Do not use any nonsteroidal anti-inflammatories (NSAIDs) like ibuprofen, Motrin, naproxen, Aleve, etc. which are all available over-the-counter.

## 2020-07-02 LAB — URINE CULTURE: Culture: <10000 — AB

## 2020-07-08 ENCOUNTER — Other Ambulatory Visit (HOSPITAL_COMMUNITY): Payer: Self-pay | Admitting: Family Medicine

## 2020-07-08 DIAGNOSIS — E1169 Type 2 diabetes mellitus with other specified complication: Secondary | ICD-10-CM | POA: Diagnosis not present

## 2020-07-08 DIAGNOSIS — I10 Essential (primary) hypertension: Secondary | ICD-10-CM | POA: Diagnosis not present

## 2020-07-08 DIAGNOSIS — J309 Allergic rhinitis, unspecified: Secondary | ICD-10-CM | POA: Diagnosis not present

## 2020-07-08 DIAGNOSIS — M25551 Pain in right hip: Secondary | ICD-10-CM | POA: Diagnosis not present

## 2020-07-08 DIAGNOSIS — E782 Mixed hyperlipidemia: Secondary | ICD-10-CM | POA: Diagnosis not present

## 2020-07-08 DIAGNOSIS — M159 Polyosteoarthritis, unspecified: Secondary | ICD-10-CM | POA: Diagnosis not present

## 2020-07-08 DIAGNOSIS — H16001 Unspecified corneal ulcer, right eye: Secondary | ICD-10-CM | POA: Diagnosis not present

## 2020-07-08 DIAGNOSIS — D509 Iron deficiency anemia, unspecified: Secondary | ICD-10-CM | POA: Diagnosis not present

## 2020-07-08 MED FILL — ROSUVASTATIN CALCIUM 10 MG: 10 | 84 days supply | Qty: 12 | Fill #0

## 2020-07-12 DIAGNOSIS — H16031 Corneal ulcer with hypopyon, right eye: Secondary | ICD-10-CM | POA: Diagnosis not present

## 2020-07-13 DIAGNOSIS — M25551 Pain in right hip: Secondary | ICD-10-CM | POA: Diagnosis not present

## 2020-07-13 DIAGNOSIS — M25552 Pain in left hip: Secondary | ICD-10-CM | POA: Diagnosis not present

## 2020-08-08 MED FILL — LISINOPRIL-HCTZ 10-12.5 MG: 10-12.5 | 90 days supply | Qty: 90 | Fill #0

## 2020-08-23 DIAGNOSIS — M7661 Achilles tendinitis, right leg: Secondary | ICD-10-CM | POA: Diagnosis not present

## 2020-08-23 DIAGNOSIS — M25571 Pain in right ankle and joints of right foot: Secondary | ICD-10-CM | POA: Diagnosis not present

## 2020-08-25 DIAGNOSIS — H17829 Peripheral opacity of cornea, unspecified eye: Secondary | ICD-10-CM | POA: Diagnosis not present

## 2020-08-25 DIAGNOSIS — H5203 Hypermetropia, bilateral: Secondary | ICD-10-CM | POA: Diagnosis not present

## 2020-08-25 DIAGNOSIS — H524 Presbyopia: Secondary | ICD-10-CM | POA: Diagnosis not present

## 2020-08-25 DIAGNOSIS — H52223 Regular astigmatism, bilateral: Secondary | ICD-10-CM | POA: Diagnosis not present

## 2020-09-02 MED FILL — PREDNISOLONE AC 1% EYE DROP: 1 | 25 days supply | Qty: 5 | Fill #1

## 2020-09-13 DIAGNOSIS — H179 Unspecified corneal scar and opacity: Secondary | ICD-10-CM | POA: Diagnosis not present

## 2020-12-15 MED FILL — PREDNISOLONE AC 1% EYE DROP: 1 | 25 days supply | Qty: 5 | Fill #1

## 2020-12-20 ENCOUNTER — Other Ambulatory Visit: Payer: Self-pay | Admitting: Family Medicine

## 2020-12-20 DIAGNOSIS — Z1231 Encounter for screening mammogram for malignant neoplasm of breast: Secondary | ICD-10-CM

## 2020-12-23 MED FILL — LISINOPRIL-HCTZ 10-12.5 MG: 10-12.5 | 90 days supply | Qty: 90 | Fill #1

## 2020-12-27 DIAGNOSIS — D509 Iron deficiency anemia, unspecified: Secondary | ICD-10-CM | POA: Diagnosis not present

## 2020-12-27 DIAGNOSIS — H1789 Other corneal scars and opacities: Secondary | ICD-10-CM | POA: Diagnosis not present

## 2020-12-27 DIAGNOSIS — H40059 Ocular hypertension, unspecified eye: Secondary | ICD-10-CM | POA: Diagnosis not present

## 2020-12-27 DIAGNOSIS — M159 Polyosteoarthritis, unspecified: Secondary | ICD-10-CM | POA: Diagnosis not present

## 2020-12-27 DIAGNOSIS — M25551 Pain in right hip: Secondary | ICD-10-CM | POA: Diagnosis not present

## 2020-12-27 DIAGNOSIS — H16001 Unspecified corneal ulcer, right eye: Secondary | ICD-10-CM | POA: Diagnosis not present

## 2020-12-27 DIAGNOSIS — Z79899 Other long term (current) drug therapy: Secondary | ICD-10-CM | POA: Diagnosis not present

## 2020-12-27 DIAGNOSIS — E1169 Type 2 diabetes mellitus with other specified complication: Secondary | ICD-10-CM | POA: Diagnosis not present

## 2020-12-27 DIAGNOSIS — J309 Allergic rhinitis, unspecified: Secondary | ICD-10-CM | POA: Diagnosis not present

## 2020-12-27 DIAGNOSIS — I10 Essential (primary) hypertension: Secondary | ICD-10-CM | POA: Diagnosis not present

## 2020-12-27 DIAGNOSIS — E782 Mixed hyperlipidemia: Secondary | ICD-10-CM | POA: Diagnosis not present

## 2020-12-27 DIAGNOSIS — H5203 Hypermetropia, bilateral: Secondary | ICD-10-CM | POA: Diagnosis not present

## 2020-12-27 DIAGNOSIS — H524 Presbyopia: Secondary | ICD-10-CM | POA: Diagnosis not present

## 2020-12-27 DIAGNOSIS — H52223 Regular astigmatism, bilateral: Secondary | ICD-10-CM | POA: Diagnosis not present

## 2021-01-03 ENCOUNTER — Other Ambulatory Visit (HOSPITAL_COMMUNITY): Payer: Self-pay | Admitting: Family Medicine

## 2021-01-03 DIAGNOSIS — Z853 Personal history of malignant neoplasm of breast: Secondary | ICD-10-CM | POA: Diagnosis not present

## 2021-01-03 DIAGNOSIS — E782 Mixed hyperlipidemia: Secondary | ICD-10-CM | POA: Diagnosis not present

## 2021-01-03 DIAGNOSIS — Z0000 Encounter for general adult medical examination without abnormal findings: Secondary | ICD-10-CM | POA: Diagnosis not present

## 2021-01-03 DIAGNOSIS — Z6836 Body mass index (BMI) 36.0-36.9, adult: Secondary | ICD-10-CM | POA: Diagnosis not present

## 2021-01-03 DIAGNOSIS — E1169 Type 2 diabetes mellitus with other specified complication: Secondary | ICD-10-CM | POA: Diagnosis not present

## 2021-01-03 DIAGNOSIS — D509 Iron deficiency anemia, unspecified: Secondary | ICD-10-CM | POA: Diagnosis not present

## 2021-01-03 DIAGNOSIS — I10 Essential (primary) hypertension: Secondary | ICD-10-CM | POA: Diagnosis not present

## 2021-01-03 DIAGNOSIS — J309 Allergic rhinitis, unspecified: Secondary | ICD-10-CM | POA: Diagnosis not present

## 2021-01-03 DIAGNOSIS — Z23 Encounter for immunization: Secondary | ICD-10-CM | POA: Diagnosis not present

## 2021-01-03 MED FILL — ROSUVASTATIN CALCIUM 10 MG: 10 | 84 days supply | Qty: 12 | Fill #0

## 2021-01-23 ENCOUNTER — Other Ambulatory Visit: Payer: Self-pay

## 2021-01-23 ENCOUNTER — Ambulatory Visit
Admission: RE | Admit: 2021-01-23 | Discharge: 2021-01-23 | Disposition: A | Discharge status: Home / Self Care | Payer: 59 | Source: Ambulatory Visit | Attending: Family Medicine | Admitting: Family Medicine

## 2021-01-23 DIAGNOSIS — Z1231 Encounter for screening mammogram for malignant neoplasm of breast: Secondary | ICD-10-CM | POA: Diagnosis not present

## 2021-01-31 ENCOUNTER — Ambulatory Visit: Payer: 59

## 2021-03-07 DIAGNOSIS — H5203 Hypermetropia, bilateral: Secondary | ICD-10-CM | POA: Diagnosis not present

## 2021-03-07 DIAGNOSIS — H2513 Age-related nuclear cataract, bilateral: Secondary | ICD-10-CM | POA: Diagnosis not present

## 2021-03-07 DIAGNOSIS — H524 Presbyopia: Secondary | ICD-10-CM | POA: Diagnosis not present

## 2021-03-07 DIAGNOSIS — H25813 Combined forms of age-related cataract, bilateral: Secondary | ICD-10-CM | POA: Diagnosis not present

## 2021-03-07 DIAGNOSIS — H52223 Regular astigmatism, bilateral: Secondary | ICD-10-CM | POA: Diagnosis not present

## 2021-04-25 ENCOUNTER — Other Ambulatory Visit (HOSPITAL_COMMUNITY): Payer: Self-pay

## 2021-04-25 DIAGNOSIS — H2513 Age-related nuclear cataract, bilateral: Secondary | ICD-10-CM | POA: Diagnosis not present

## 2021-04-25 DIAGNOSIS — H2511 Age-related nuclear cataract, right eye: Secondary | ICD-10-CM | POA: Diagnosis not present

## 2021-04-25 DIAGNOSIS — I10 Essential (primary) hypertension: Secondary | ICD-10-CM | POA: Diagnosis not present

## 2021-04-25 DIAGNOSIS — H18413 Arcus senilis, bilateral: Secondary | ICD-10-CM | POA: Diagnosis not present

## 2021-04-25 DIAGNOSIS — H25013 Cortical age-related cataract, bilateral: Secondary | ICD-10-CM | POA: Diagnosis not present

## 2021-04-25 DIAGNOSIS — H40013 Open angle with borderline findings, low risk, bilateral: Secondary | ICD-10-CM | POA: Diagnosis not present

## 2021-04-25 MED ORDER — PROLENSA 0.07 % OP SOLN
1.0000 [drp] | Freq: Every day | OPHTHALMIC | 1 refills | Status: AC
  Filled 2021-04-25: qty 3, 60d supply, fill #0

## 2021-04-25 MED ORDER — DUREZOL 0.05 % OP EMUL
1.0000 [drp] | Freq: Three times a day (TID) | OPHTHALMIC | 1 refills | Status: AC
  Filled 2021-04-25: qty 5, 34d supply, fill #0

## 2021-04-25 MED ORDER — BESIVANCE 0.6 % OP SUSP
1.0000 [drp] | Freq: Three times a day (TID) | OPHTHALMIC | 1 refills | Status: AC
  Filled 2021-04-25: qty 5, 25d supply, fill #0
  Filled 2021-06-07: qty 5, 25d supply, fill #1

## 2021-04-26 ENCOUNTER — Other Ambulatory Visit (HOSPITAL_COMMUNITY): Payer: Self-pay

## 2021-04-26 MED ORDER — DIFLUPREDNATE 0.05 % OP EMUL
1.0000 [drp] | Freq: Four times a day (QID) | OPHTHALMIC | 1 refills | Status: AC
  Filled 2021-04-26: qty 5, 25d supply, fill #0
  Filled 2021-06-12: qty 5, 25d supply, fill #1

## 2021-04-27 ENCOUNTER — Other Ambulatory Visit (HOSPITAL_COMMUNITY): Payer: Self-pay

## 2021-05-01 DIAGNOSIS — H2511 Age-related nuclear cataract, right eye: Secondary | ICD-10-CM | POA: Diagnosis not present

## 2021-05-01 DIAGNOSIS — Z9841 Cataract extraction status, right eye: Secondary | ICD-10-CM | POA: Diagnosis not present

## 2021-05-01 DIAGNOSIS — Z961 Presence of intraocular lens: Secondary | ICD-10-CM | POA: Diagnosis not present

## 2021-05-02 ENCOUNTER — Other Ambulatory Visit (HOSPITAL_COMMUNITY): Payer: Self-pay

## 2021-05-02 DIAGNOSIS — H2512 Age-related nuclear cataract, left eye: Secondary | ICD-10-CM | POA: Diagnosis not present

## 2021-05-02 MED ORDER — PROLENSA 0.07 % OP SOLN
1.0000 [drp] | Freq: Every day | OPHTHALMIC | 1 refills | Status: AC
  Filled 2021-05-02: qty 3, 28d supply, fill #0

## 2021-05-02 MED ORDER — DIFLUPREDNATE 0.05 % OP EMUL
1.0000 [drp] | Freq: Four times a day (QID) | OPHTHALMIC | 1 refills | Status: AC
  Filled 2021-05-02: qty 5, 25d supply, fill #0

## 2021-05-02 MED ORDER — BESIVANCE 0.6 % OP SUSP
1.0000 [drp] | Freq: Three times a day (TID) | OPHTHALMIC | 1 refills | Status: AC
  Filled 2021-05-02: qty 5, 34d supply, fill #0

## 2021-05-23 ENCOUNTER — Other Ambulatory Visit (HOSPITAL_COMMUNITY): Payer: Self-pay

## 2021-05-23 MED FILL — Lisinopril & Hydrochlorothiazide Tab 10-12.5 MG: ORAL | 90 days supply | Qty: 90 | Fill #0 | Status: AC

## 2021-06-02 DIAGNOSIS — H52223 Regular astigmatism, bilateral: Secondary | ICD-10-CM | POA: Diagnosis not present

## 2021-06-02 DIAGNOSIS — H2512 Age-related nuclear cataract, left eye: Secondary | ICD-10-CM | POA: Diagnosis not present

## 2021-06-02 DIAGNOSIS — H5201 Hypermetropia, right eye: Secondary | ICD-10-CM | POA: Diagnosis not present

## 2021-06-02 DIAGNOSIS — Z961 Presence of intraocular lens: Secondary | ICD-10-CM | POA: Diagnosis not present

## 2021-06-07 ENCOUNTER — Other Ambulatory Visit (HOSPITAL_COMMUNITY): Payer: Self-pay

## 2021-06-12 ENCOUNTER — Other Ambulatory Visit (HOSPITAL_COMMUNITY): Payer: Self-pay

## 2021-06-26 ENCOUNTER — Other Ambulatory Visit (HOSPITAL_COMMUNITY): Payer: Self-pay

## 2021-06-27 ENCOUNTER — Other Ambulatory Visit (HOSPITAL_COMMUNITY): Payer: Self-pay

## 2021-07-03 DIAGNOSIS — R7989 Other specified abnormal findings of blood chemistry: Secondary | ICD-10-CM | POA: Diagnosis not present

## 2021-07-03 DIAGNOSIS — E782 Mixed hyperlipidemia: Secondary | ICD-10-CM | POA: Diagnosis not present

## 2021-07-03 DIAGNOSIS — Z0000 Encounter for general adult medical examination without abnormal findings: Secondary | ICD-10-CM | POA: Diagnosis not present

## 2021-07-03 DIAGNOSIS — Z6836 Body mass index (BMI) 36.0-36.9, adult: Secondary | ICD-10-CM | POA: Diagnosis not present

## 2021-07-03 DIAGNOSIS — J309 Allergic rhinitis, unspecified: Secondary | ICD-10-CM | POA: Diagnosis not present

## 2021-07-03 DIAGNOSIS — Z853 Personal history of malignant neoplasm of breast: Secondary | ICD-10-CM | POA: Diagnosis not present

## 2021-07-03 DIAGNOSIS — E1169 Type 2 diabetes mellitus with other specified complication: Secondary | ICD-10-CM | POA: Diagnosis not present

## 2021-07-03 DIAGNOSIS — D509 Iron deficiency anemia, unspecified: Secondary | ICD-10-CM | POA: Diagnosis not present

## 2021-07-03 DIAGNOSIS — E559 Vitamin D deficiency, unspecified: Secondary | ICD-10-CM | POA: Diagnosis not present

## 2021-07-03 DIAGNOSIS — I10 Essential (primary) hypertension: Secondary | ICD-10-CM | POA: Diagnosis not present

## 2021-07-10 ENCOUNTER — Other Ambulatory Visit (HOSPITAL_COMMUNITY): Payer: Self-pay

## 2021-07-10 DIAGNOSIS — E669 Obesity, unspecified: Secondary | ICD-10-CM | POA: Diagnosis not present

## 2021-07-10 DIAGNOSIS — J309 Allergic rhinitis, unspecified: Secondary | ICD-10-CM | POA: Diagnosis not present

## 2021-07-10 DIAGNOSIS — I10 Essential (primary) hypertension: Secondary | ICD-10-CM | POA: Diagnosis not present

## 2021-07-10 DIAGNOSIS — E1169 Type 2 diabetes mellitus with other specified complication: Secondary | ICD-10-CM | POA: Diagnosis not present

## 2021-07-10 DIAGNOSIS — Z23 Encounter for immunization: Secondary | ICD-10-CM | POA: Diagnosis not present

## 2021-07-10 DIAGNOSIS — M7731 Calcaneal spur, right foot: Secondary | ICD-10-CM | POA: Diagnosis not present

## 2021-07-10 DIAGNOSIS — E782 Mixed hyperlipidemia: Secondary | ICD-10-CM | POA: Diagnosis not present

## 2021-07-10 DIAGNOSIS — R7989 Other specified abnormal findings of blood chemistry: Secondary | ICD-10-CM | POA: Diagnosis not present

## 2021-07-10 DIAGNOSIS — M159 Polyosteoarthritis, unspecified: Secondary | ICD-10-CM | POA: Diagnosis not present

## 2021-07-10 MED ORDER — LISINOPRIL-HYDROCHLOROTHIAZIDE 10-12.5 MG PO TABS
1.0000 | ORAL_TABLET | Freq: Every day | ORAL | 1 refills | Status: DC
  Filled 2021-07-10: qty 90, 90d supply, fill #0

## 2021-07-10 MED ORDER — ROSUVASTATIN CALCIUM 10 MG PO TABS
10.0000 mg | ORAL_TABLET | ORAL | 1 refills | Status: DC
  Filled 2021-07-10: qty 13, 90d supply, fill #0

## 2021-07-11 ENCOUNTER — Other Ambulatory Visit (HOSPITAL_COMMUNITY): Payer: Self-pay

## 2021-07-11 MED ORDER — VOLTAREN 1 % EX GEL
2.0000 g | Freq: Four times a day (QID) | CUTANEOUS | 0 refills | Status: AC
  Filled 2021-07-11: qty 300, 37d supply, fill #0

## 2021-07-18 ENCOUNTER — Other Ambulatory Visit (HOSPITAL_COMMUNITY): Payer: Self-pay

## 2021-08-10 DIAGNOSIS — Z853 Personal history of malignant neoplasm of breast: Secondary | ICD-10-CM | POA: Diagnosis not present

## 2021-08-10 DIAGNOSIS — C50912 Malignant neoplasm of unspecified site of left female breast: Secondary | ICD-10-CM | POA: Diagnosis not present

## 2021-08-29 DIAGNOSIS — Z853 Personal history of malignant neoplasm of breast: Secondary | ICD-10-CM | POA: Diagnosis not present

## 2021-08-29 DIAGNOSIS — C50912 Malignant neoplasm of unspecified site of left female breast: Secondary | ICD-10-CM | POA: Diagnosis not present

## 2021-09-11 IMAGING — MG MM DIGITAL SCREENING BILAT W/ TOMO AND CAD
8 series · 8 of 24 positions shown · non-contrast
Comparison: Previous exam(s).

CLINICAL DATA: Screening.

EXAM:
DIGITAL SCREENING BILATERAL MAMMOGRAM WITH TOMOSYNTHESIS AND CAD
TECHNIQUE: Bilateral screening digital craniocaudal and mediolateral oblique
mammograms were obtained. Bilateral screening digital breast
tomosynthesis was performed. The images were evaluated with
computer-aided detection.

[R MLO synth-2D]
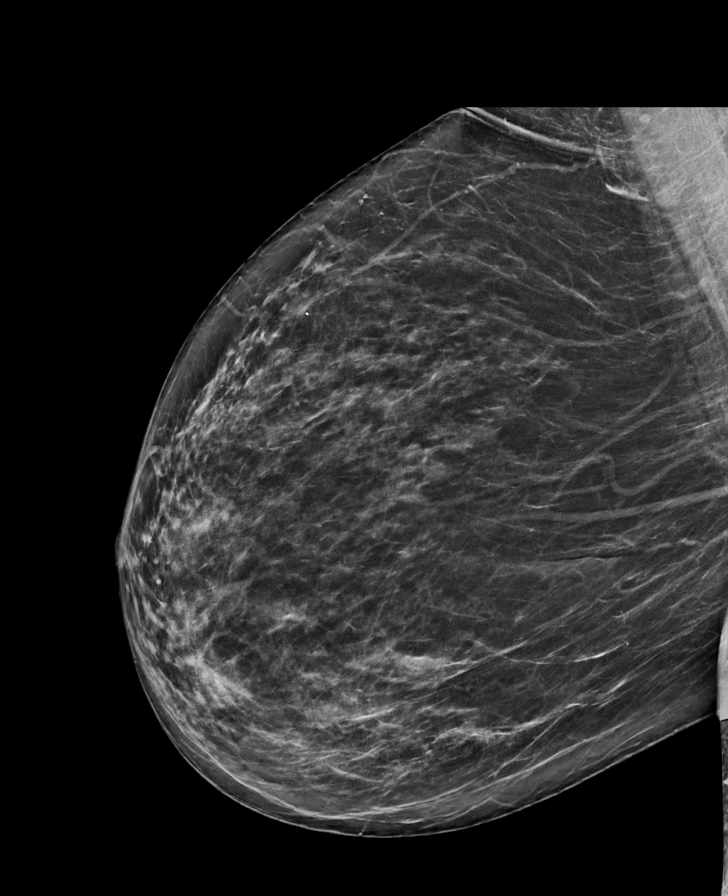

[L CC synth-2D]
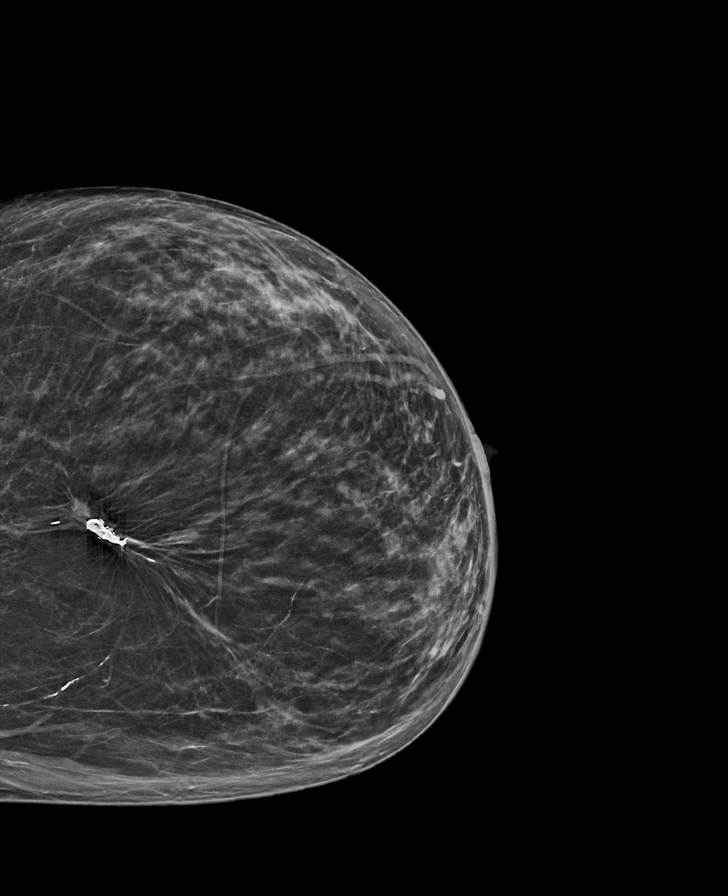

[L MLO synth-2D]
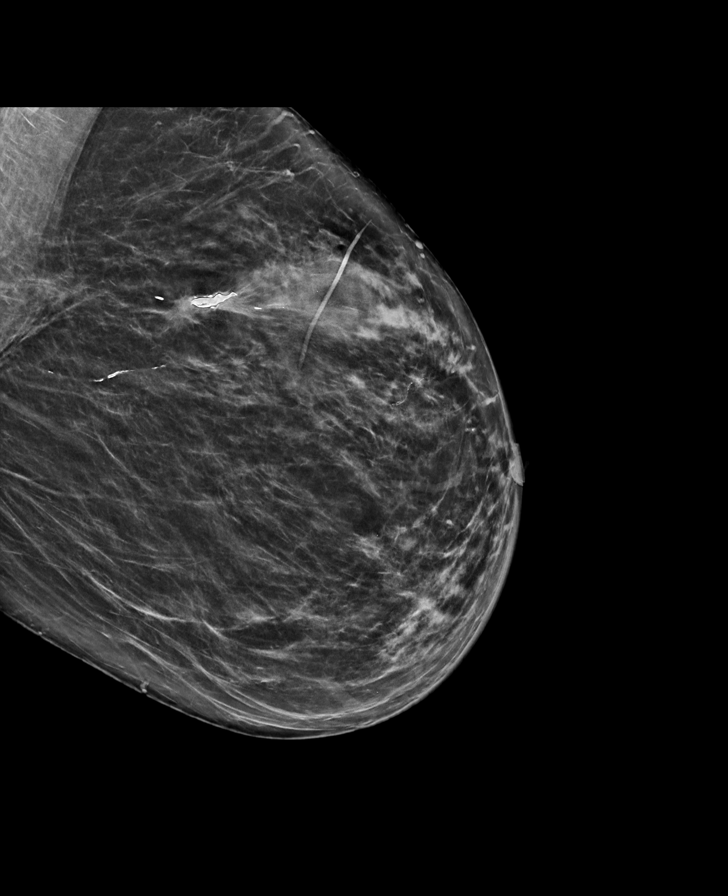

[R CC synth-2D]
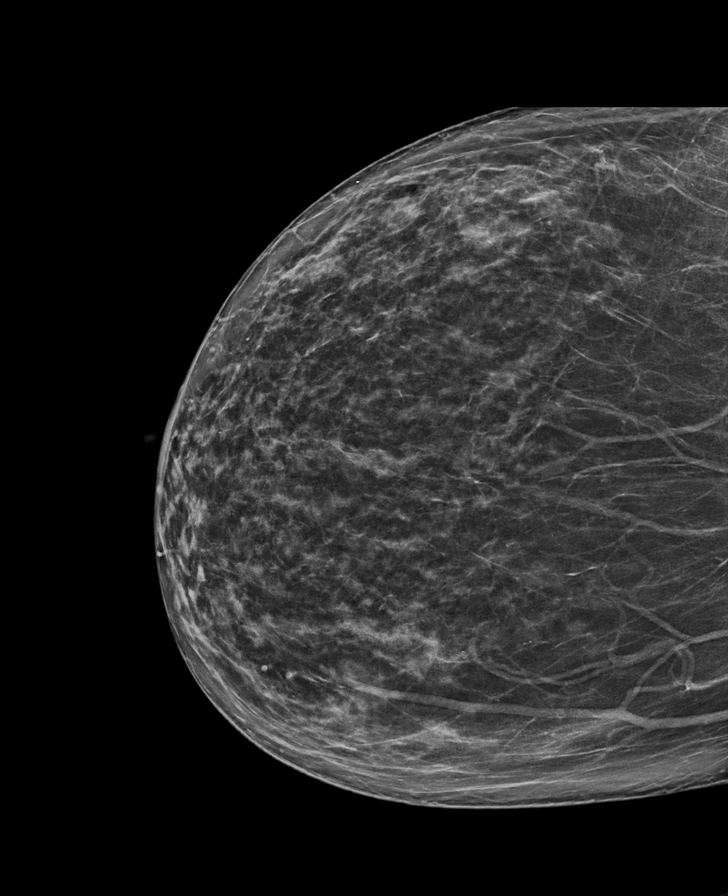

[R CC tomo · tomo slice 39/76.0]
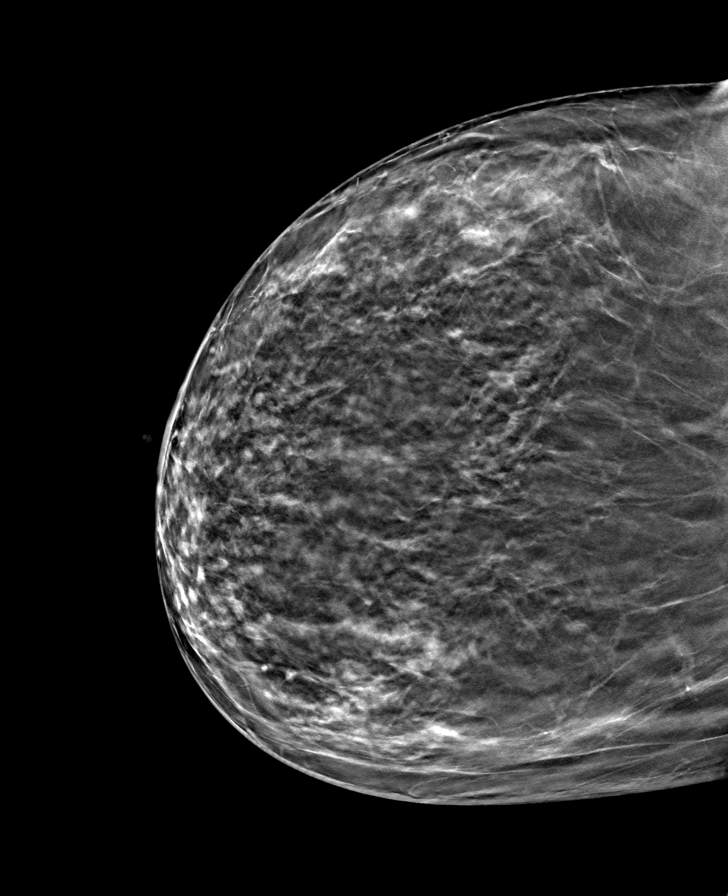

[R MLO tomo · tomo slice 41/82.0]
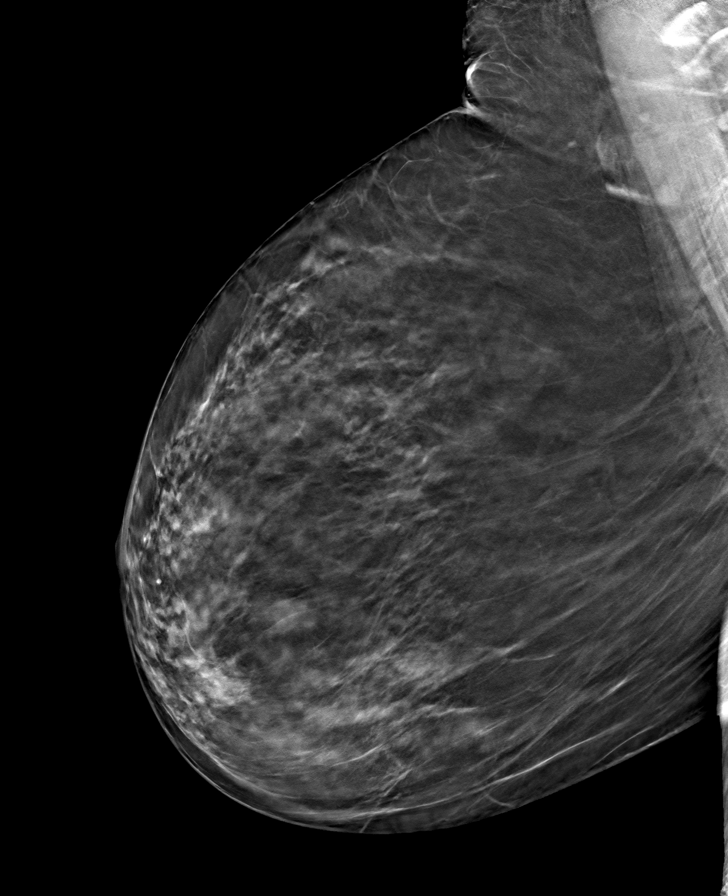

[L MLO tomo · tomo slice 39/77.0]
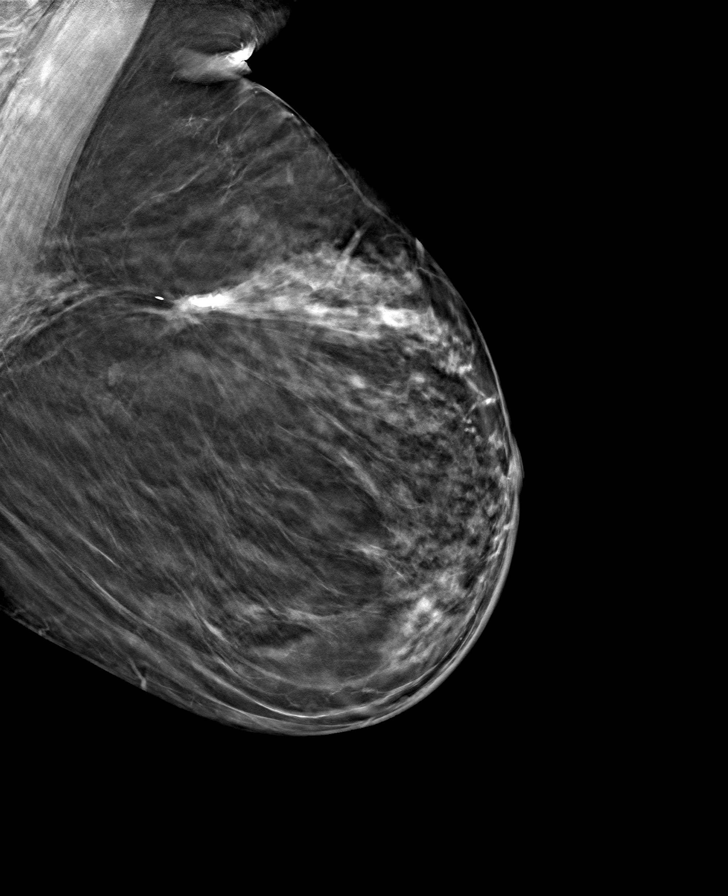

[L CC tomo · tomo slice 36/71.0]
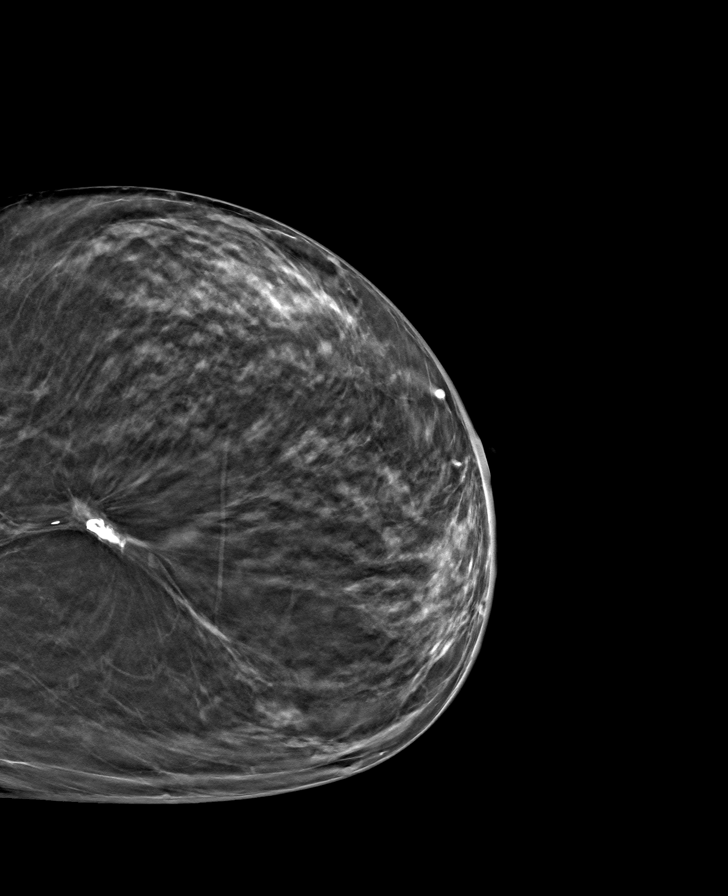

[8 of 24 positions shown; findings below may reference images not displayed]

ACR Breast Density Category b: There are scattered areas of
fibroglandular density.
FINDINGS: There are no findings suspicious for malignancy.
IMPRESSION: No mammographic evidence of malignancy. A result letter of this
screening mammogram will be mailed directly to the patient.

RECOMMENDATION:
Screening mammogram in one year. (Code:51-O-LD2)

BI-RADS CATEGORY  1: Negative.

## 2021-10-03 ENCOUNTER — Other Ambulatory Visit (HOSPITAL_COMMUNITY): Payer: Self-pay

## 2021-10-03 MED FILL — Lisinopril & Hydrochlorothiazide Tab 10-12.5 MG: ORAL | 90 days supply | Qty: 90 | Fill #1 | Status: AC

## 2021-11-01 ENCOUNTER — Other Ambulatory Visit (HOSPITAL_COMMUNITY): Payer: Self-pay

## 2021-11-01 MED ORDER — LISINOPRIL-HYDROCHLOROTHIAZIDE 20-12.5 MG PO TABS
1.0000 | ORAL_TABLET | Freq: Every day | ORAL | 1 refills | Status: AC
  Filled 2021-11-01: qty 90, 90d supply, fill #0
  Filled 2022-02-16: qty 90, 90d supply, fill #1

## 2021-11-28 DIAGNOSIS — I10 Essential (primary) hypertension: Secondary | ICD-10-CM | POA: Diagnosis not present

## 2021-11-29 ENCOUNTER — Other Ambulatory Visit (HOSPITAL_COMMUNITY): Payer: Self-pay

## 2021-11-29 MED ORDER — AMLODIPINE BESYLATE 2.5 MG PO TABS
2.5000 mg | ORAL_TABLET | Freq: Every day | ORAL | 1 refills | Status: AC
  Filled 2021-11-29: qty 30, 30d supply, fill #0

## 2021-11-30 ENCOUNTER — Other Ambulatory Visit (HOSPITAL_COMMUNITY): Payer: Self-pay

## 2021-12-01 ENCOUNTER — Other Ambulatory Visit: Payer: Self-pay | Admitting: Family Medicine

## 2021-12-01 DIAGNOSIS — Z1231 Encounter for screening mammogram for malignant neoplasm of breast: Secondary | ICD-10-CM

## 2021-12-14 DIAGNOSIS — E1169 Type 2 diabetes mellitus with other specified complication: Secondary | ICD-10-CM | POA: Diagnosis not present

## 2021-12-14 DIAGNOSIS — E782 Mixed hyperlipidemia: Secondary | ICD-10-CM | POA: Diagnosis not present

## 2021-12-19 ENCOUNTER — Other Ambulatory Visit (HOSPITAL_COMMUNITY): Payer: Self-pay

## 2021-12-19 MED ORDER — AMLODIPINE BESYLATE 5 MG PO TABS
5.0000 mg | ORAL_TABLET | Freq: Every day | ORAL | 1 refills | Status: AC
  Filled 2021-12-19: qty 30, 30d supply, fill #0

## 2021-12-20 ENCOUNTER — Other Ambulatory Visit (HOSPITAL_COMMUNITY): Payer: Self-pay

## 2022-01-04 ENCOUNTER — Other Ambulatory Visit (HOSPITAL_COMMUNITY): Payer: Self-pay

## 2022-01-04 DIAGNOSIS — E669 Obesity, unspecified: Secondary | ICD-10-CM | POA: Diagnosis not present

## 2022-01-04 DIAGNOSIS — E1169 Type 2 diabetes mellitus with other specified complication: Secondary | ICD-10-CM | POA: Diagnosis not present

## 2022-01-04 DIAGNOSIS — Z23 Encounter for immunization: Secondary | ICD-10-CM | POA: Diagnosis not present

## 2022-01-04 DIAGNOSIS — I10 Essential (primary) hypertension: Secondary | ICD-10-CM | POA: Diagnosis not present

## 2022-01-04 DIAGNOSIS — E782 Mixed hyperlipidemia: Secondary | ICD-10-CM | POA: Diagnosis not present

## 2022-01-04 DIAGNOSIS — M159 Polyosteoarthritis, unspecified: Secondary | ICD-10-CM | POA: Diagnosis not present

## 2022-01-04 DIAGNOSIS — Z0000 Encounter for general adult medical examination without abnormal findings: Secondary | ICD-10-CM | POA: Diagnosis not present

## 2022-01-04 MED ORDER — ROSUVASTATIN CALCIUM 10 MG PO TABS
10.0000 mg | ORAL_TABLET | ORAL | 1 refills | Status: AC
  Filled 2022-01-04: qty 24, 84d supply, fill #0

## 2022-01-11 ENCOUNTER — Other Ambulatory Visit (HOSPITAL_COMMUNITY): Payer: Self-pay

## 2022-01-11 DIAGNOSIS — I10 Essential (primary) hypertension: Secondary | ICD-10-CM | POA: Diagnosis not present

## 2022-01-11 DIAGNOSIS — E1169 Type 2 diabetes mellitus with other specified complication: Secondary | ICD-10-CM | POA: Diagnosis not present

## 2022-01-11 DIAGNOSIS — E782 Mixed hyperlipidemia: Secondary | ICD-10-CM | POA: Diagnosis not present

## 2022-01-11 DIAGNOSIS — M159 Polyosteoarthritis, unspecified: Secondary | ICD-10-CM | POA: Diagnosis not present

## 2022-01-11 MED ORDER — ROSUVASTATIN CALCIUM 10 MG PO TABS
10.0000 mg | ORAL_TABLET | ORAL | 1 refills | Status: AC
  Filled 2022-01-11: qty 40, 94d supply, fill #0

## 2022-01-11 MED ORDER — AMLODIPINE BESYLATE 5 MG PO TABS
5.0000 mg | ORAL_TABLET | Freq: Every day | ORAL | 1 refills | Status: AC
  Filled 2022-01-11: qty 90, 90d supply, fill #0

## 2022-01-11 MED ORDER — LISINOPRIL-HYDROCHLOROTHIAZIDE 20-12.5 MG PO TABS
1.0000 | ORAL_TABLET | Freq: Every day | ORAL | 1 refills | Status: AC
  Filled 2022-01-11 – 2022-05-22 (×2): qty 90, 90d supply, fill #0
  Filled 2022-08-30: qty 90, 90d supply, fill #1

## 2022-01-23 ENCOUNTER — Ambulatory Visit
Admission: RE | Admit: 2022-01-23 | Discharge: 2022-01-23 | Disposition: A | Discharge status: Home / Self Care | Payer: 59 | Source: Ambulatory Visit | Attending: Family Medicine | Admitting: Family Medicine

## 2022-01-23 DIAGNOSIS — Z1231 Encounter for screening mammogram for malignant neoplasm of breast: Secondary | ICD-10-CM | POA: Diagnosis not present

## 2022-02-16 ENCOUNTER — Other Ambulatory Visit (HOSPITAL_COMMUNITY): Payer: Self-pay

## 2022-05-22 ENCOUNTER — Other Ambulatory Visit (HOSPITAL_COMMUNITY): Payer: Self-pay

## 2022-07-02 DIAGNOSIS — E1169 Type 2 diabetes mellitus with other specified complication: Secondary | ICD-10-CM | POA: Diagnosis not present

## 2022-07-02 DIAGNOSIS — E782 Mixed hyperlipidemia: Secondary | ICD-10-CM | POA: Diagnosis not present

## 2022-07-09 ENCOUNTER — Other Ambulatory Visit (HOSPITAL_COMMUNITY): Payer: Self-pay

## 2022-07-09 DIAGNOSIS — E2839 Other primary ovarian failure: Secondary | ICD-10-CM | POA: Diagnosis not present

## 2022-07-09 DIAGNOSIS — I10 Essential (primary) hypertension: Secondary | ICD-10-CM | POA: Diagnosis not present

## 2022-07-09 DIAGNOSIS — E782 Mixed hyperlipidemia: Secondary | ICD-10-CM | POA: Diagnosis not present

## 2022-07-09 DIAGNOSIS — M159 Polyosteoarthritis, unspecified: Secondary | ICD-10-CM | POA: Diagnosis not present

## 2022-07-09 DIAGNOSIS — Z6837 Body mass index (BMI) 37.0-37.9, adult: Secondary | ICD-10-CM | POA: Diagnosis not present

## 2022-07-09 DIAGNOSIS — Z13820 Encounter for screening for osteoporosis: Secondary | ICD-10-CM | POA: Diagnosis not present

## 2022-07-09 DIAGNOSIS — E1169 Type 2 diabetes mellitus with other specified complication: Secondary | ICD-10-CM | POA: Diagnosis not present

## 2022-07-09 DIAGNOSIS — M62838 Other muscle spasm: Secondary | ICD-10-CM | POA: Diagnosis not present

## 2022-07-09 MED ORDER — AMLODIPINE BESYLATE 5 MG PO TABS
5.0000 mg | ORAL_TABLET | Freq: Every day | ORAL | 3 refills | Status: AC
  Filled 2022-07-09: qty 90, 90d supply, fill #0
  Filled 2022-10-15: qty 90, 90d supply, fill #1
  Filled 2023-01-25: qty 90, 90d supply, fill #2

## 2022-07-09 MED ORDER — METFORMIN HCL ER 500 MG PO TB24
500.0000 mg | ORAL_TABLET | Freq: Every day | ORAL | 1 refills | Status: AC
  Filled 2022-07-09: qty 90, 90d supply, fill #0

## 2022-07-09 MED ORDER — ROSUVASTATIN CALCIUM 10 MG PO TABS
10.0000 mg | ORAL_TABLET | ORAL | 3 refills | Status: AC
  Filled 2022-07-09: qty 39, 90d supply, fill #0

## 2022-07-09 MED ORDER — LISINOPRIL-HYDROCHLOROTHIAZIDE 20-12.5 MG PO TABS
1.0000 | ORAL_TABLET | Freq: Every day | ORAL | 3 refills | Status: DC
  Filled 2022-07-09 – 2022-12-13 (×2): qty 90, 90d supply, fill #0
  Filled 2023-03-29: qty 90, 90d supply, fill #1
  Filled 2023-07-01: qty 90, 90d supply, fill #2

## 2022-07-10 ENCOUNTER — Other Ambulatory Visit: Payer: Self-pay | Admitting: Family Medicine

## 2022-07-10 ENCOUNTER — Other Ambulatory Visit (HOSPITAL_COMMUNITY): Payer: Self-pay

## 2022-07-10 DIAGNOSIS — E2839 Other primary ovarian failure: Secondary | ICD-10-CM

## 2022-07-11 ENCOUNTER — Other Ambulatory Visit (HOSPITAL_COMMUNITY): Payer: Self-pay

## 2022-07-11 MED ORDER — DICLOFENAC SODIUM 1 % EX GEL
2.0000 g | Freq: Four times a day (QID) | CUTANEOUS | 1 refills | Status: AC | PRN
  Filled 2022-07-11: qty 300, 38d supply, fill #0

## 2022-07-25 ENCOUNTER — Other Ambulatory Visit (HOSPITAL_COMMUNITY): Payer: Self-pay

## 2022-07-25 DIAGNOSIS — Q6689 Other  specified congenital deformities of feet: Secondary | ICD-10-CM | POA: Diagnosis not present

## 2022-07-25 DIAGNOSIS — M79671 Pain in right foot: Secondary | ICD-10-CM | POA: Diagnosis not present

## 2022-07-25 MED ORDER — PREDNISONE 5 MG PO TABS
ORAL_TABLET | ORAL | 0 refills | Status: AC
  Filled 2022-07-25: qty 21, 6d supply, fill #0

## 2022-08-29 DIAGNOSIS — H17821 Peripheral opacity of cornea, right eye: Secondary | ICD-10-CM | POA: Diagnosis not present

## 2022-08-29 DIAGNOSIS — H524 Presbyopia: Secondary | ICD-10-CM | POA: Diagnosis not present

## 2022-08-29 DIAGNOSIS — Z9849 Cataract extraction status, unspecified eye: Secondary | ICD-10-CM | POA: Diagnosis not present

## 2022-08-29 DIAGNOSIS — H52223 Regular astigmatism, bilateral: Secondary | ICD-10-CM | POA: Diagnosis not present

## 2022-08-29 DIAGNOSIS — H5201 Hypermetropia, right eye: Secondary | ICD-10-CM | POA: Diagnosis not present

## 2022-08-29 DIAGNOSIS — Z961 Presence of intraocular lens: Secondary | ICD-10-CM | POA: Diagnosis not present

## 2022-08-29 DIAGNOSIS — H5212 Myopia, left eye: Secondary | ICD-10-CM | POA: Diagnosis not present

## 2022-08-30 ENCOUNTER — Other Ambulatory Visit (HOSPITAL_COMMUNITY): Payer: Self-pay

## 2022-10-12 ENCOUNTER — Other Ambulatory Visit (HOSPITAL_COMMUNITY): Payer: Self-pay

## 2022-10-15 ENCOUNTER — Other Ambulatory Visit (HOSPITAL_COMMUNITY): Payer: Self-pay

## 2022-10-17 ENCOUNTER — Other Ambulatory Visit (HOSPITAL_COMMUNITY): Payer: Self-pay

## 2022-10-17 MED ORDER — CELECOXIB 200 MG PO CAPS
200.0000 mg | ORAL_CAPSULE | Freq: Every day | ORAL | 0 refills | Status: AC | PRN
  Filled 2022-10-17: qty 30, 30d supply, fill #0

## 2022-12-13 ENCOUNTER — Other Ambulatory Visit (HOSPITAL_COMMUNITY): Payer: Self-pay

## 2022-12-21 ENCOUNTER — Ambulatory Visit
Admission: RE | Admit: 2022-12-21 | Discharge: 2022-12-21 | Disposition: A | Discharge status: Home / Self Care | Payer: Commercial Managed Care - PPO | Source: Ambulatory Visit | Attending: Family Medicine | Admitting: Family Medicine

## 2022-12-21 DIAGNOSIS — E2839 Other primary ovarian failure: Secondary | ICD-10-CM

## 2022-12-21 DIAGNOSIS — Z780 Asymptomatic menopausal state: Secondary | ICD-10-CM | POA: Diagnosis not present

## 2023-01-02 DIAGNOSIS — E1169 Type 2 diabetes mellitus with other specified complication: Secondary | ICD-10-CM | POA: Diagnosis not present

## 2023-01-02 DIAGNOSIS — I10 Essential (primary) hypertension: Secondary | ICD-10-CM | POA: Diagnosis not present

## 2023-01-07 DIAGNOSIS — N393 Stress incontinence (female) (male): Secondary | ICD-10-CM | POA: Diagnosis not present

## 2023-01-07 DIAGNOSIS — Z0000 Encounter for general adult medical examination without abnormal findings: Secondary | ICD-10-CM | POA: Diagnosis not present

## 2023-01-07 DIAGNOSIS — M159 Polyosteoarthritis, unspecified: Secondary | ICD-10-CM | POA: Diagnosis not present

## 2023-01-07 DIAGNOSIS — E1169 Type 2 diabetes mellitus with other specified complication: Secondary | ICD-10-CM | POA: Diagnosis not present

## 2023-01-07 DIAGNOSIS — I10 Essential (primary) hypertension: Secondary | ICD-10-CM | POA: Diagnosis not present

## 2023-01-07 DIAGNOSIS — Z23 Encounter for immunization: Secondary | ICD-10-CM | POA: Diagnosis not present

## 2023-01-07 DIAGNOSIS — E782 Mixed hyperlipidemia: Secondary | ICD-10-CM | POA: Diagnosis not present

## 2023-01-07 DIAGNOSIS — Z6837 Body mass index (BMI) 37.0-37.9, adult: Secondary | ICD-10-CM | POA: Diagnosis not present

## 2023-01-17 DIAGNOSIS — M25561 Pain in right knee: Secondary | ICD-10-CM | POA: Diagnosis not present

## 2023-01-17 DIAGNOSIS — M19071 Primary osteoarthritis, right ankle and foot: Secondary | ICD-10-CM | POA: Diagnosis not present

## 2023-01-17 DIAGNOSIS — M25562 Pain in left knee: Secondary | ICD-10-CM | POA: Diagnosis not present

## 2023-01-21 ENCOUNTER — Other Ambulatory Visit: Payer: Self-pay | Admitting: Family Medicine

## 2023-01-21 DIAGNOSIS — Z1231 Encounter for screening mammogram for malignant neoplasm of breast: Secondary | ICD-10-CM

## 2023-01-25 ENCOUNTER — Other Ambulatory Visit (HOSPITAL_COMMUNITY): Payer: Self-pay

## 2023-02-27 ENCOUNTER — Ambulatory Visit: Payer: Commercial Managed Care - PPO

## 2023-03-29 ENCOUNTER — Other Ambulatory Visit (HOSPITAL_COMMUNITY): Payer: Self-pay

## 2023-04-01 ENCOUNTER — Ambulatory Visit
Admission: RE | Admit: 2023-04-01 | Discharge: 2023-04-01 | Disposition: A | Discharge status: Home / Self Care | Payer: Commercial Managed Care - PPO | Source: Ambulatory Visit | Attending: Family Medicine | Admitting: Family Medicine

## 2023-04-01 DIAGNOSIS — Z1231 Encounter for screening mammogram for malignant neoplasm of breast: Secondary | ICD-10-CM | POA: Diagnosis not present

## 2023-06-18 ENCOUNTER — Other Ambulatory Visit (HOSPITAL_COMMUNITY): Payer: Self-pay

## 2023-06-18 MED ORDER — FAMOTIDINE 20 MG PO TABS
20.0000 mg | ORAL_TABLET | Freq: Two times a day (BID) | ORAL | 0 refills | Status: AC
  Filled 2023-06-18 – 2023-07-01 (×2): qty 60, 30d supply, fill #0

## 2023-06-18 MED ORDER — IBUPROFEN 800 MG PO TABS
800.0000 mg | ORAL_TABLET | Freq: Three times a day (TID) | ORAL | 0 refills | Status: AC
  Filled 2023-06-18 – 2023-07-01 (×2): qty 90, 30d supply, fill #0

## 2023-07-01 ENCOUNTER — Other Ambulatory Visit (HOSPITAL_COMMUNITY): Payer: Self-pay

## 2023-07-02 ENCOUNTER — Other Ambulatory Visit (HOSPITAL_COMMUNITY): Payer: Self-pay

## 2023-07-10 ENCOUNTER — Other Ambulatory Visit: Payer: Self-pay

## 2023-07-10 ENCOUNTER — Other Ambulatory Visit (HOSPITAL_COMMUNITY): Payer: Self-pay

## 2023-07-10 DIAGNOSIS — M159 Polyosteoarthritis, unspecified: Secondary | ICD-10-CM | POA: Diagnosis not present

## 2023-07-10 DIAGNOSIS — M791 Myalgia, unspecified site: Secondary | ICD-10-CM | POA: Diagnosis not present

## 2023-07-10 DIAGNOSIS — E782 Mixed hyperlipidemia: Secondary | ICD-10-CM | POA: Diagnosis not present

## 2023-07-10 DIAGNOSIS — I10 Essential (primary) hypertension: Secondary | ICD-10-CM | POA: Diagnosis not present

## 2023-07-10 DIAGNOSIS — Z6838 Body mass index (BMI) 38.0-38.9, adult: Secondary | ICD-10-CM | POA: Diagnosis not present

## 2023-07-10 DIAGNOSIS — E1169 Type 2 diabetes mellitus with other specified complication: Secondary | ICD-10-CM | POA: Diagnosis not present

## 2023-07-10 DIAGNOSIS — M25522 Pain in left elbow: Secondary | ICD-10-CM | POA: Diagnosis not present

## 2023-07-10 MED ORDER — AMLODIPINE BESYLATE 5 MG PO TABS
5.0000 mg | ORAL_TABLET | Freq: Every day | ORAL | 3 refills | Status: DC
  Filled 2023-07-10: qty 90, 90d supply, fill #0

## 2023-07-10 MED ORDER — LISINOPRIL-HYDROCHLOROTHIAZIDE 20-12.5 MG PO TABS
1.0000 | ORAL_TABLET | Freq: Every day | ORAL | 3 refills | Status: AC
  Filled 2023-07-10: qty 80, 80d supply, fill #0
  Filled 2023-07-10: qty 10, 10d supply, fill #0
  Filled 2023-10-10: qty 90, 90d supply, fill #0

## 2023-07-11 ENCOUNTER — Other Ambulatory Visit (HOSPITAL_COMMUNITY): Payer: Self-pay

## 2023-07-13 ENCOUNTER — Other Ambulatory Visit (HOSPITAL_COMMUNITY): Payer: Self-pay

## 2023-07-14 ENCOUNTER — Other Ambulatory Visit (HOSPITAL_COMMUNITY): Payer: Self-pay

## 2023-07-14 MED ORDER — ROSUVASTATIN CALCIUM 10 MG PO TABS: 10.0000 mg | ORAL_TABLET | ORAL | 1 refills | Status: DC

## 2023-07-15 ENCOUNTER — Other Ambulatory Visit (HOSPITAL_COMMUNITY): Payer: Self-pay

## 2023-07-16 ENCOUNTER — Other Ambulatory Visit (HOSPITAL_COMMUNITY): Payer: Self-pay

## 2023-07-16 MED ORDER — ROSUVASTATIN CALCIUM 10 MG PO TABS
10.0000 mg | ORAL_TABLET | ORAL | 1 refills | Status: AC
  Filled 2023-07-16: qty 26, 90d supply, fill #0

## 2023-07-19 ENCOUNTER — Other Ambulatory Visit (HOSPITAL_COMMUNITY): Payer: Self-pay

## 2023-09-06 DIAGNOSIS — H524 Presbyopia: Secondary | ICD-10-CM | POA: Diagnosis not present

## 2023-10-10 ENCOUNTER — Other Ambulatory Visit (HOSPITAL_COMMUNITY): Payer: Self-pay

## 2023-10-15 DIAGNOSIS — M791 Myalgia, unspecified site: Secondary | ICD-10-CM | POA: Diagnosis not present

## 2023-10-15 DIAGNOSIS — E782 Mixed hyperlipidemia: Secondary | ICD-10-CM | POA: Diagnosis not present

## 2023-11-27 ENCOUNTER — Other Ambulatory Visit (HOSPITAL_COMMUNITY): Payer: Self-pay

## 2023-11-27 DIAGNOSIS — M25562 Pain in left knee: Secondary | ICD-10-CM | POA: Diagnosis not present

## 2023-11-27 DIAGNOSIS — M1712 Unilateral primary osteoarthritis, left knee: Secondary | ICD-10-CM | POA: Diagnosis not present

## 2023-11-27 MED ORDER — CELECOXIB 200 MG PO CAPS
200.0000 mg | ORAL_CAPSULE | Freq: Every day | ORAL | 3 refills | Status: AC | PRN
  Filled 2023-11-27: qty 30, 30d supply, fill #0
  Filled 2024-07-15: qty 30, 30d supply, fill #1
  Filled 2024-10-20: qty 30, 30d supply, fill #0

## 2023-12-23 ENCOUNTER — Other Ambulatory Visit (HOSPITAL_COMMUNITY): Payer: Self-pay

## 2023-12-23 MED ORDER — FLUOROMETHOLONE 0.1 % OP SUSP
1.0000 [drp] | Freq: Four times a day (QID) | OPHTHALMIC | 0 refills | Status: AC
  Filled 2023-12-23: qty 5, 7d supply, fill #0

## 2024-01-10 DIAGNOSIS — E1169 Type 2 diabetes mellitus with other specified complication: Secondary | ICD-10-CM | POA: Diagnosis not present

## 2024-01-10 DIAGNOSIS — E782 Mixed hyperlipidemia: Secondary | ICD-10-CM | POA: Diagnosis not present

## 2024-01-16 ENCOUNTER — Other Ambulatory Visit (HOSPITAL_COMMUNITY): Payer: Self-pay

## 2024-01-16 DIAGNOSIS — E782 Mixed hyperlipidemia: Secondary | ICD-10-CM | POA: Diagnosis not present

## 2024-01-16 DIAGNOSIS — E1169 Type 2 diabetes mellitus with other specified complication: Secondary | ICD-10-CM | POA: Diagnosis not present

## 2024-01-16 DIAGNOSIS — M159 Polyosteoarthritis, unspecified: Secondary | ICD-10-CM | POA: Diagnosis not present

## 2024-01-16 DIAGNOSIS — Z6838 Body mass index (BMI) 38.0-38.9, adult: Secondary | ICD-10-CM | POA: Diagnosis not present

## 2024-01-16 DIAGNOSIS — I10 Essential (primary) hypertension: Secondary | ICD-10-CM | POA: Diagnosis not present

## 2024-01-16 DIAGNOSIS — R058 Other specified cough: Secondary | ICD-10-CM | POA: Diagnosis not present

## 2024-01-16 DIAGNOSIS — Z23 Encounter for immunization: Secondary | ICD-10-CM | POA: Diagnosis not present

## 2024-01-16 DIAGNOSIS — Z0000 Encounter for general adult medical examination without abnormal findings: Secondary | ICD-10-CM | POA: Diagnosis not present

## 2024-01-16 MED ORDER — LISINOPRIL-HYDROCHLOROTHIAZIDE 20-12.5 MG PO TABS
1.0000 | ORAL_TABLET | Freq: Every day | ORAL | 1 refills | Status: DC
  Filled 2024-01-16: qty 90, 90d supply, fill #0
  Filled 2024-04-30: qty 90, 90d supply, fill #1

## 2024-01-16 MED ORDER — AMLODIPINE BESYLATE 5 MG PO TABS
5.0000 mg | ORAL_TABLET | Freq: Every day | ORAL | 1 refills | Status: DC
  Filled 2024-01-16: qty 90, 90d supply, fill #0
  Filled 2024-04-30: qty 90, 90d supply, fill #1

## 2024-01-16 MED ORDER — ROSUVASTATIN CALCIUM 10 MG PO TABS
10.0000 mg | ORAL_TABLET | ORAL | 1 refills | Status: DC
  Filled 2024-01-16: qty 24, 84d supply, fill #0

## 2024-01-20 ENCOUNTER — Other Ambulatory Visit (HOSPITAL_COMMUNITY): Payer: Self-pay

## 2024-04-08 ENCOUNTER — Other Ambulatory Visit: Payer: Self-pay | Admitting: Family Medicine

## 2024-04-08 DIAGNOSIS — Z1231 Encounter for screening mammogram for malignant neoplasm of breast: Secondary | ICD-10-CM

## 2024-04-30 ENCOUNTER — Other Ambulatory Visit (HOSPITAL_COMMUNITY): Payer: Self-pay

## 2024-06-09 ENCOUNTER — Ambulatory Visit
Admission: RE | Admit: 2024-06-09 | Discharge: 2024-06-09 | Disposition: A | Discharge status: Home / Self Care | Source: Ambulatory Visit | Attending: Family Medicine | Admitting: Family Medicine

## 2024-06-09 DIAGNOSIS — Z1231 Encounter for screening mammogram for malignant neoplasm of breast: Secondary | ICD-10-CM

## 2024-07-16 ENCOUNTER — Other Ambulatory Visit (HOSPITAL_COMMUNITY): Payer: Self-pay

## 2024-07-16 DIAGNOSIS — H5201 Hypermetropia, right eye: Secondary | ICD-10-CM | POA: Diagnosis not present

## 2024-07-16 DIAGNOSIS — M25562 Pain in left knee: Secondary | ICD-10-CM | POA: Diagnosis not present

## 2024-07-16 DIAGNOSIS — I10 Essential (primary) hypertension: Secondary | ICD-10-CM | POA: Diagnosis not present

## 2024-07-16 DIAGNOSIS — R600 Localized edema: Secondary | ICD-10-CM | POA: Diagnosis not present

## 2024-07-16 DIAGNOSIS — Z1211 Encounter for screening for malignant neoplasm of colon: Secondary | ICD-10-CM | POA: Diagnosis not present

## 2024-07-16 DIAGNOSIS — Z6839 Body mass index (BMI) 39.0-39.9, adult: Secondary | ICD-10-CM | POA: Diagnosis not present

## 2024-07-16 DIAGNOSIS — E782 Mixed hyperlipidemia: Secondary | ICD-10-CM | POA: Diagnosis not present

## 2024-07-16 DIAGNOSIS — E1169 Type 2 diabetes mellitus with other specified complication: Secondary | ICD-10-CM | POA: Diagnosis not present

## 2024-07-16 DIAGNOSIS — M159 Polyosteoarthritis, unspecified: Secondary | ICD-10-CM | POA: Diagnosis not present

## 2024-07-16 MED ORDER — AMLODIPINE BESYLATE 5 MG PO TABS: 5.0000 mg | ORAL_TABLET | Freq: Every day | ORAL | 4 refills | Status: DC

## 2024-07-16 MED ORDER — LISINOPRIL-HYDROCHLOROTHIAZIDE 20-12.5 MG PO TABS
1.0000 | ORAL_TABLET | Freq: Every day | ORAL | 4 refills | Status: AC
  Filled 2024-08-03 – 2024-10-20 (×2): qty 90, 90d supply, fill #0

## 2024-07-20 ENCOUNTER — Other Ambulatory Visit (HOSPITAL_BASED_OUTPATIENT_CLINIC_OR_DEPARTMENT_OTHER): Payer: Self-pay | Admitting: Family Medicine

## 2024-07-20 DIAGNOSIS — E782 Mixed hyperlipidemia: Secondary | ICD-10-CM

## 2024-07-21 ENCOUNTER — Other Ambulatory Visit (HOSPITAL_COMMUNITY): Payer: Self-pay

## 2024-07-30 DIAGNOSIS — M79671 Pain in right foot: Secondary | ICD-10-CM | POA: Diagnosis not present

## 2024-07-30 DIAGNOSIS — M25572 Pain in left ankle and joints of left foot: Secondary | ICD-10-CM | POA: Diagnosis not present

## 2024-07-30 DIAGNOSIS — M1712 Unilateral primary osteoarthritis, left knee: Secondary | ICD-10-CM | POA: Diagnosis not present

## 2024-08-03 ENCOUNTER — Other Ambulatory Visit (HOSPITAL_COMMUNITY): Payer: Self-pay

## 2024-09-18 ENCOUNTER — Ambulatory Visit (HOSPITAL_COMMUNITY)
Admission: RE | Admit: 2024-09-18 | Discharge: 2024-09-18 | Disposition: A | Discharge status: Home / Self Care | Payer: Self-pay | Source: Ambulatory Visit | Attending: Family Medicine | Admitting: Family Medicine

## 2024-09-18 DIAGNOSIS — E782 Mixed hyperlipidemia: Secondary | ICD-10-CM | POA: Insufficient documentation

## 2024-10-20 ENCOUNTER — Other Ambulatory Visit: Payer: Self-pay

## 2024-10-20 ENCOUNTER — Other Ambulatory Visit (HOSPITAL_COMMUNITY): Payer: Self-pay
